# Patient Record
Sex: Male | Born: 1935 | Race: White | Hispanic: No | Smoking: Never smoker
Health system: Southern US, Community
[De-identification: ages and names within clinical notes are randomized; demographics above are authoritative.]

## PROBLEM LIST (undated history)

## (undated) DIAGNOSIS — E119 Type 2 diabetes mellitus without complications: Secondary | ICD-10-CM

## (undated) DIAGNOSIS — I890 Lymphedema, not elsewhere classified: Secondary | ICD-10-CM

## (undated) DIAGNOSIS — N189 Chronic kidney disease, unspecified: Secondary | ICD-10-CM

## (undated) DIAGNOSIS — J449 Chronic obstructive pulmonary disease, unspecified: Secondary | ICD-10-CM

## (undated) DIAGNOSIS — I509 Heart failure, unspecified: Secondary | ICD-10-CM

## (undated) DIAGNOSIS — I10 Essential (primary) hypertension: Secondary | ICD-10-CM

## (undated) HISTORY — PX: REPLACEMENT TOTAL KNEE: SUR1224

## (undated) HISTORY — DX: Heart failure, unspecified: I50.9

## (undated) HISTORY — PX: CATARACT EXTRACTION, BILATERAL: SHX1313

---

## 2003-03-31 ENCOUNTER — Encounter: Payer: Self-pay | Admitting: Orthopaedic Surgery

## 2003-03-31 ENCOUNTER — Ambulatory Visit: Admission: RE | Admit: 2003-03-31 | Discharge: 2003-03-31 | Payer: Self-pay | Admitting: Orthopaedic Surgery

## 2003-04-27 ENCOUNTER — Inpatient Hospital Stay (HOSPITAL_COMMUNITY): Admission: RE | Admit: 2003-04-27 | Discharge: 2003-04-30 | Payer: Self-pay | Admitting: Orthopaedic Surgery

## 2003-04-30 ENCOUNTER — Inpatient Hospital Stay (HOSPITAL_COMMUNITY)
Admission: RE | Admit: 2003-04-30 | Discharge: 2003-05-06 | Payer: Self-pay | Admitting: Physical Medicine & Rehabilitation

## 2004-12-24 ENCOUNTER — Inpatient Hospital Stay: Payer: Self-pay | Admitting: Internal Medicine

## 2004-12-29 ENCOUNTER — Encounter: Payer: Self-pay | Admitting: Physical Medicine and Rehabilitation

## 2005-03-02 ENCOUNTER — Encounter: Payer: Self-pay | Admitting: Physical Medicine and Rehabilitation

## 2005-03-31 ENCOUNTER — Encounter: Payer: Self-pay | Admitting: Physical Medicine and Rehabilitation

## 2006-08-30 ENCOUNTER — Inpatient Hospital Stay: Payer: Self-pay | Admitting: Internal Medicine

## 2006-09-02 ENCOUNTER — Other Ambulatory Visit: Payer: Self-pay

## 2009-01-19 ENCOUNTER — Encounter: Payer: Self-pay | Admitting: Internal Medicine

## 2009-01-25 ENCOUNTER — Ambulatory Visit: Payer: Self-pay | Admitting: Internal Medicine

## 2009-01-29 ENCOUNTER — Encounter: Payer: Self-pay | Admitting: Internal Medicine

## 2009-03-01 ENCOUNTER — Encounter: Payer: Self-pay | Admitting: Internal Medicine

## 2009-03-31 ENCOUNTER — Encounter: Payer: Self-pay | Admitting: Internal Medicine

## 2009-05-01 ENCOUNTER — Encounter: Payer: Self-pay | Admitting: Internal Medicine

## 2009-06-21 ENCOUNTER — Encounter: Payer: Self-pay | Admitting: Internal Medicine

## 2010-05-22 ENCOUNTER — Inpatient Hospital Stay: Payer: Self-pay | Admitting: Internal Medicine

## 2010-05-24 LAB — PSA

## 2011-07-02 ENCOUNTER — Encounter: Payer: Self-pay | Admitting: Cardiothoracic Surgery

## 2011-07-02 ENCOUNTER — Encounter: Payer: Self-pay | Admitting: Nurse Practitioner

## 2011-07-03 ENCOUNTER — Ambulatory Visit: Payer: Self-pay | Admitting: Internal Medicine

## 2012-12-23 ENCOUNTER — Encounter: Payer: Self-pay | Admitting: Cardiothoracic Surgery

## 2012-12-23 ENCOUNTER — Encounter: Payer: Self-pay | Admitting: Nurse Practitioner

## 2012-12-30 ENCOUNTER — Encounter: Payer: Self-pay | Admitting: Nurse Practitioner

## 2012-12-30 ENCOUNTER — Encounter: Payer: Self-pay | Admitting: Cardiothoracic Surgery

## 2013-01-29 ENCOUNTER — Encounter: Payer: Self-pay | Admitting: Nurse Practitioner

## 2013-01-29 ENCOUNTER — Encounter: Payer: Self-pay | Admitting: Cardiothoracic Surgery

## 2013-03-01 ENCOUNTER — Encounter: Payer: Self-pay | Admitting: Cardiothoracic Surgery

## 2013-03-01 ENCOUNTER — Encounter: Payer: Self-pay | Admitting: Nurse Practitioner

## 2014-03-09 ENCOUNTER — Encounter: Payer: Self-pay | Admitting: Surgery

## 2014-04-06 ENCOUNTER — Encounter: Payer: Self-pay | Admitting: Nurse Practitioner

## 2014-05-01 ENCOUNTER — Encounter: Payer: Self-pay | Admitting: Nurse Practitioner

## 2014-06-01 ENCOUNTER — Encounter: Payer: Self-pay | Admitting: Nurse Practitioner

## 2016-02-16 ENCOUNTER — Encounter: Payer: Self-pay | Admitting: Emergency Medicine

## 2016-02-16 ENCOUNTER — Inpatient Hospital Stay
Admission: EM | Admit: 2016-02-16 | Discharge: 2016-02-20 | DRG: 558 | Disposition: A | Discharge status: Home / Self Care | Payer: Medicare Other | Attending: Internal Medicine | Admitting: Internal Medicine

## 2016-02-16 ENCOUNTER — Emergency Department: Payer: Medicare Other

## 2016-02-16 DIAGNOSIS — N390 Urinary tract infection, site not specified: Secondary | ICD-10-CM | POA: Diagnosis present

## 2016-02-16 DIAGNOSIS — M25511 Pain in right shoulder: Secondary | ICD-10-CM

## 2016-02-16 DIAGNOSIS — R0902 Hypoxemia: Secondary | ICD-10-CM

## 2016-02-16 DIAGNOSIS — I10 Essential (primary) hypertension: Secondary | ICD-10-CM | POA: Diagnosis present

## 2016-02-16 DIAGNOSIS — M6282 Rhabdomyolysis: Principal | ICD-10-CM | POA: Diagnosis present

## 2016-02-16 DIAGNOSIS — Z8249 Family history of ischemic heart disease and other diseases of the circulatory system: Secondary | ICD-10-CM

## 2016-02-16 DIAGNOSIS — R600 Localized edema: Secondary | ICD-10-CM | POA: Diagnosis present

## 2016-02-16 DIAGNOSIS — N400 Enlarged prostate without lower urinary tract symptoms: Secondary | ICD-10-CM | POA: Diagnosis present

## 2016-02-16 DIAGNOSIS — Z66 Do not resuscitate: Secondary | ICD-10-CM | POA: Diagnosis present

## 2016-02-16 DIAGNOSIS — N179 Acute kidney failure, unspecified: Secondary | ICD-10-CM | POA: Diagnosis present

## 2016-02-16 DIAGNOSIS — D219 Benign neoplasm of connective and other soft tissue, unspecified: Secondary | ICD-10-CM | POA: Diagnosis present

## 2016-02-16 DIAGNOSIS — Z79899 Other long term (current) drug therapy: Secondary | ICD-10-CM

## 2016-02-16 DIAGNOSIS — I890 Lymphedema, not elsewhere classified: Secondary | ICD-10-CM | POA: Diagnosis present

## 2016-02-16 DIAGNOSIS — E860 Dehydration: Secondary | ICD-10-CM | POA: Diagnosis present

## 2016-02-16 HISTORY — DX: Essential (primary) hypertension: I10

## 2016-02-16 LAB — COMPREHENSIVE METABOLIC PANEL
ALBUMIN: 3.1 g/dL — AB (ref 3.5–5.0)
ALK PHOS: 73 U/L (ref 38–126)
ALT: 20 U/L (ref 17–63)
ANION GAP: 11 (ref 5–15)
AST: 62 U/L — ABNORMAL HIGH (ref 15–41)
BUN: 35 mg/dL — ABNORMAL HIGH (ref 6–20)
CALCIUM: 10.3 mg/dL (ref 8.9–10.3)
CHLORIDE: 102 mmol/L (ref 101–111)
CO2: 26 mmol/L (ref 22–32)
Creatinine, Ser: 2.03 mg/dL — ABNORMAL HIGH (ref 0.61–1.24)
GFR calc Af Amer: 34 mL/min — ABNORMAL LOW (ref >60)
GFR calc non Af Amer: 30 mL/min — ABNORMAL LOW (ref >60)
GLUCOSE: 215 mg/dL — AB (ref 65–99)
POTASSIUM: 3.6 mmol/L (ref 3.5–5.1)
SODIUM: 139 mmol/L (ref 135–145)
Total Bilirubin: 1 mg/dL (ref 0.3–1.2)
Total Protein: 7.2 g/dL (ref 6.5–8.1)

## 2016-02-16 LAB — CBC
HCT: 50.1 % (ref 40.0–52.0)
HEMOGLOBIN: 16.3 g/dL (ref 13.0–18.0)
MCH: 27.6 pg (ref 26.0–34.0)
MCHC: 32.5 g/dL (ref 32.0–36.0)
MCV: 84.9 fL (ref 80.0–100.0)
PLATELETS: 173 10*3/uL (ref 150–440)
RBC: 5.9 MIL/uL (ref 4.40–5.90)
RDW: 15.2 % — ABNORMAL HIGH (ref 11.5–14.5)
WBC: 26.7 10*3/uL — ABNORMAL HIGH (ref 3.8–10.6)

## 2016-02-16 LAB — URINALYSIS COMPLETE WITH MICROSCOPIC (ARMC ONLY)
Bacteria, UA: NONE SEEN
Bilirubin Urine: NEGATIVE
Glucose, UA: 150 mg/dL — AB
LEUKOCYTES UA: NEGATIVE
NITRITE: NEGATIVE
PH: 5 (ref 5.0–8.0)
SPECIFIC GRAVITY, URINE: 1.025 (ref 1.005–1.030)
Squamous Epithelial / LPF: NONE SEEN

## 2016-02-16 LAB — GLUCOSE, CAPILLARY: Glucose-Capillary: 240 mg/dL — ABNORMAL HIGH (ref 65–99)

## 2016-02-16 LAB — TROPONIN I: Troponin I: 0.12 ng/mL — ABNORMAL HIGH (ref <0.031)

## 2016-02-16 LAB — CK: Total CK: 4579 U/L — ABNORMAL HIGH (ref 49–397)

## 2016-02-16 MED ORDER — OXYCODONE HCL 5 MG PO TABS
5.0000 mg | ORAL_TABLET | ORAL | Status: DC | PRN
  Administered 2016-02-16 – 2016-02-17 (×2): 5 mg via ORAL
  Filled 2016-02-16 (×2): qty 1

## 2016-02-16 MED ORDER — ACETAMINOPHEN 325 MG PO TABS
650.0000 mg | ORAL_TABLET | Freq: Four times a day (QID) | ORAL | Status: DC | PRN
Start: 2016-02-16 — End: 2016-02-20

## 2016-02-16 MED ORDER — ONDANSETRON HCL 4 MG/2ML IJ SOLN: 4.0000 mg | Freq: Four times a day (QID) | INTRAMUSCULAR | Status: DC | PRN

## 2016-02-16 MED ORDER — ONDANSETRON HCL 4 MG PO TABS
4.0000 mg | ORAL_TABLET | Freq: Four times a day (QID) | ORAL | Status: DC | PRN
Start: 2016-02-16 — End: 2016-02-20

## 2016-02-16 MED ORDER — SODIUM CHLORIDE 0.9 % IV SOLN
1000.0000 mL | Freq: Once | INTRAVENOUS | Status: AC
  Administered 2016-02-16: 1000 mL via INTRAVENOUS

## 2016-02-16 MED ORDER — FINASTERIDE 5 MG PO TABS
5.0000 mg | ORAL_TABLET | Freq: Every day | ORAL | Status: DC
  Administered 2016-02-16 – 2016-02-20 (×5): 5 mg via ORAL
  Filled 2016-02-16 (×5): qty 1

## 2016-02-16 MED ORDER — AMLODIPINE BESYLATE 5 MG PO TABS
5.0000 mg | ORAL_TABLET | Freq: Every day | ORAL | Status: DC
  Administered 2016-02-16 – 2016-02-20 (×5): 5 mg via ORAL
  Filled 2016-02-16 (×5): qty 1

## 2016-02-16 MED ORDER — SODIUM CHLORIDE 0.9 % IV SOLN
INTRAVENOUS | Status: DC
  Administered 2016-02-16 – 2016-02-17 (×3): via INTRAVENOUS

## 2016-02-16 MED ORDER — ENOXAPARIN SODIUM 30 MG/0.3ML ~~LOC~~ SOLN: 30.0000 mg | SUBCUTANEOUS | Status: DC

## 2016-02-16 MED ORDER — CEFTRIAXONE SODIUM 1 G IJ SOLR
1.0000 g | Freq: Once | INTRAMUSCULAR | Status: AC
  Administered 2016-02-16: 1 g via INTRAVENOUS
  Filled 2016-02-16: qty 10

## 2016-02-16 MED ORDER — DEXTROSE 5 % IV SOLN
1.0000 g | INTRAVENOUS | Status: DC
  Administered 2016-02-17 – 2016-02-19 (×3): 1 g via INTRAVENOUS
  Filled 2016-02-16 (×4): qty 10

## 2016-02-16 MED ORDER — ACETAMINOPHEN 650 MG RE SUPP
650.0000 mg | Freq: Four times a day (QID) | RECTAL | Status: DC | PRN
Start: 2016-02-16 — End: 2016-02-20

## 2016-02-16 MED ORDER — LORATADINE 10 MG PO TABS
10.0000 mg | ORAL_TABLET | Freq: Every day | ORAL | Status: DC | PRN
Start: 2016-02-16 — End: 2016-02-20

## 2016-02-16 MED ORDER — ENOXAPARIN SODIUM 40 MG/0.4ML ~~LOC~~ SOLN
40.0000 mg | SUBCUTANEOUS | Status: DC
  Administered 2016-02-16: 18:00:00 40 mg via SUBCUTANEOUS
  Filled 2016-02-16: qty 0.4

## 2016-02-16 NOTE — ED Notes (Signed)
MD at bedside. 

## 2016-02-16 NOTE — ED Notes (Signed)
Lab called regarding add on urine culture at this time.

## 2016-02-16 NOTE — ED Provider Notes (Signed)
[facility] Emergency Department Provider Note  ____________________________________________    I have reviewed the triage vital signs and the nursing notes.   HISTORY  Chief Complaint Fall    HPI James Collins is a 81 y.o. male who presents from home. Patient apparently fell 2 days ago and has been lying on the floor since. He was unable to get up. He reports he did not hit his head. He denies dizziness, fevers, chest pain. He was found by [employer] representative     Past Medical History  Diagnosis Date  . Hypertension     There are no active problems to display for this patient.   No past surgical history on file.  No current outpatient prescriptions on file.  Allergies Review of patient's allergies indicates no known allergies.  No family history on file.  Social History Social History  Substance Use Topics  . Smoking status: Never Smoker   . Smokeless tobacco: None  . Alcohol Use: No    Review of Systems  Constitutional: Negative for fever. Eyes: Negative for redness  Cardiovascular: Negative for chest pain Respiratory: Negative for Cough Gastrointestinal: Negative for abdominal pain Genitourinary: Negative for dysuria. Musculoskeletal: Negative for back pain. Skin: Positive for right leg rash Neurological: Negative for focal weakness Psychiatric: no anxiety    ____________________________________________   PHYSICAL EXAM:  VITAL SIGNS: ED Triage Vitals  Enc Vitals Group     BP 02/16/16 1146 118/79 mmHg     Pulse Rate 02/16/16 1146 102     Resp 02/16/16 1146 18     Temp 02/16/16 1146 97.6 F (36.4 C)     Temp Source 02/16/16 1146 Oral     SpO2 02/16/16 1146 94 %     Weight 02/16/16 1146 250 lb (113.399 kg)     Height 02/16/16 1146 5\' 9"  (1.753 m)     Head Cir --      Peak Flow --      Pain Score --      Pain Loc --      Pain Edu? --      Excl. in [place]? --     Constitutional: Alert and oriented.   Eyes: Conjunctivae are normal. No erythema or injection ENT   Head: Normocephalic and atraumatic.   Mouth/Throat: Mucous membranes are moist. Cardiovascular: Normal rate, regular rhythm. Normal and symmetric distal pulses are present in the upper extremities.  Respiratory: Normal respiratory effort without tachypnea nor retractions. Breath sounds are clear and equal bilaterally.  Gastrointestinal: Soft and non-tender in all quadrants. No distention. There is no CVA tenderness. Genitourinary: No abnormalities Musculoskeletal: Nontender with normal range of motion in all extremities. No lower extremity tenderness nor edema. Neurologic:  Normal speech and language. No gross focal neurologic deficits are appreciated. Skin:  Skin is warm, dry and intact. Erythema and several bullae along the right flank/right hip or patient was apparently lying in his own urine Psychiatric: Mood and affect are normal. Patient exhibits appropriate insight and judgment.  ____________________________________________    LABS (pertinent positives/negatives)  Labs Reviewed  CBC - Abnormal; Notable for the following:    WBC 26.7 (*)    RDW 15.2 (*)    All other components within normal limits  COMPREHENSIVE METABOLIC PANEL - Abnormal; Notable for the following:    Glucose, Bld 215 (*)    BUN 35 (*)    Creatinine, Ser 2.03 (*)    Albumin 3.1 (*)    AST 62 (*)  GFR calc non Af Amer 30 (*)    GFR calc Af Amer 34 (*)    All other components within normal limits  CK - Abnormal; Notable for the following:    Total CK 4579 (*)    All other components within normal limits  TROPONIN I - Abnormal; Notable for the following:    Troponin I 0.12 (*)    All other components within normal limits  URINALYSIS COMPLETEWITH MICROSCOPIC ([facility] ONLY)    ____________________________________________   EKG  ED ECG REPORT I, Lavonia Drafts, the attending physician, personally viewed and interpreted this  ECG.  Date: 02/16/2016 EKG Time: 1:24 PM  Rhythm: normal sinus rhythm QRS Axis: normal Intervals: normal ST/T Wave abnormalities: normal Conduction Disturbances: none    ____________________________________________    RADIOLOGY  Chest x-ray unremarkable  ____________________________________________   PROCEDURES  Procedure(s) performed: none  Critical Care performed: yes  CRITICAL CARE Performed by: Lavonia Drafts   Total critical care time: 30 minutes  Critical care time was exclusive of separately billable procedures and treating other patients.  Critical care was necessary to treat or prevent imminent or life-threatening deterioration.  Critical care was time spent personally by me on the following activities: development of treatment plan with patient and/or surrogate as well as nursing, discussions with consultants, evaluation of patient's response to treatment, examination of patient, obtaining history from patient or surrogate, ordering and performing treatments and interventions, ordering and review of laboratory studies, ordering and review of radiographic studies, pulse oximetry and re-evaluation of patient's condition.   ____________________________________________   INITIAL IMPRESSION / ASSESSMENT AND PLAN / ED COURSE  Pertinent labs & imaging results that were available during my care of the patient were reviewed by me and considered in my medical decision making (see chart for details).  Patient presents from home after being on the floor for 2 days. He does have some skin breakdown on the right, concern for rhabdo. We will send labs, give IV fluids check x-ray and urine and reevaluate  ----------------------------------------- 2:49 PM on 02/16/2016 -----------------------------------------  Patient with elevated troponin, but unremarkable EKG. elevated CK consistent with rhabdomyolysis. He also has a significantly elevated white blood cell count  although no evidence of infection. We will give IV fluids and admit to the hospital for further management  ____________________________________________   FINAL CLINICAL IMPRESSION(S) / ED DIAGNOSES  Final diagnoses:  Non-traumatic rhabdomyolysis          Lavonia Drafts, MD 02/16/16 1551

## 2016-02-16 NOTE — ED Notes (Signed)
Pt arrived via EMS from home. Pt was found on the floor of his home by [employer]. Unknown how long pt was laying on floor. Upon EMS arrival, pt was alert but disoriented to date and time. Pt is incontinent of urine and clothes are soaked with urine. EMS reports 128/98, CBG 294, 89 % RA. EMS applied O2 3L via nasal cannula.
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
## 2016-02-16 NOTE — ED Notes (Signed)
Patient had wet gown and bed linen. Changed gown, linen and bed pad and put a depends back on the patient. Patient in bed resting at this time, requesting nothing further.

## 2016-02-17 ENCOUNTER — Inpatient Hospital Stay: Payer: Medicare Other

## 2016-02-17 LAB — CK: Total CK: 3689 U/L — ABNORMAL HIGH (ref 49–397)

## 2016-02-17 LAB — BASIC METABOLIC PANEL
Anion gap: 7 (ref 5–15)
BUN: 45 mg/dL — ABNORMAL HIGH (ref 6–20)
CALCIUM: 8.6 mg/dL — AB (ref 8.9–10.3)
CO2: 25 mmol/L (ref 22–32)
CREATININE: 2 mg/dL — AB (ref 0.61–1.24)
Chloride: 106 mmol/L (ref 101–111)
GFR calc Af Amer: 35 mL/min — ABNORMAL LOW (ref >60)
GFR, EST NON AFRICAN AMERICAN: 30 mL/min — AB (ref >60)
GLUCOSE: 193 mg/dL — AB (ref 65–99)
POTASSIUM: 3.6 mmol/L (ref 3.5–5.1)
SODIUM: 138 mmol/L (ref 135–145)

## 2016-02-17 LAB — HEMOGLOBIN A1C: HEMOGLOBIN A1C: 7.2 % — AB (ref 4.0–6.0)

## 2016-02-17 LAB — GLUCOSE, CAPILLARY
Glucose-Capillary: 172 mg/dL — ABNORMAL HIGH (ref 65–99)
Glucose-Capillary: 160 mg/dL — ABNORMAL HIGH (ref 65–99)
Glucose-Capillary: 187 mg/dL — ABNORMAL HIGH (ref 65–99)

## 2016-02-17 LAB — TROPONIN I: Troponin I: 0.11 ng/mL — ABNORMAL HIGH (ref <0.031)

## 2016-02-17 MED ORDER — INSULIN ASPART 100 UNIT/ML ~~LOC~~ SOLN
0.0000 [IU] | Freq: Three times a day (TID) | SUBCUTANEOUS | Status: DC
  Administered 2016-02-17 (×2): 2 [IU] via SUBCUTANEOUS
  Administered 2016-02-18 (×2): 1 [IU] via SUBCUTANEOUS
  Administered 2016-02-18: 2 [IU] via SUBCUTANEOUS
  Administered 2016-02-19: 1 [IU] via SUBCUTANEOUS
  Administered 2016-02-19 – 2016-02-20 (×4): 2 [IU] via SUBCUTANEOUS
  Filled 2016-02-17 (×2): qty 2
  Filled 2016-02-17: qty 1
  Filled 2016-02-17: qty 2
  Filled 2016-02-17: qty 1
  Filled 2016-02-17 (×2): qty 2
  Filled 2016-02-17: qty 1
  Filled 2016-02-17 (×2): qty 2

## 2016-02-17 MED ORDER — ENOXAPARIN SODIUM 40 MG/0.4ML ~~LOC~~ SOLN
40.0000 mg | Freq: Two times a day (BID) | SUBCUTANEOUS | Status: DC
  Administered 2016-02-17 – 2016-02-20 (×7): 40 mg via SUBCUTANEOUS
  Filled 2016-02-17 (×7): qty 0.4
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
## 2016-02-17 NOTE — Progress Notes (Signed)
Inpatient Diabetes Program Recommendations  AACE/ADA: New Consensus Statement on Inpatient Glycemic Control (2015)  Target Ranges:  Prepandial:   less than 140 mg/dL      Peak postprandial:   less than 180 mg/dL (1-2 hours)      Critically ill patients:  140 - 180 mg/dL  Results for SHAI, FREUDENTHAL (MRN [id number]) as of 02/17/2016 09:08  Ref. Range 02/16/2016 12:35 02/17/2016 04:10  Glucose Latest Ref Range: 65-99 mg/dL 215 (H) 193 (H)   Review of Glycemic Control  Diabetes history: DM2 hx (per Care Everywhere tab;noted in office note by podiatry on 02/08/16 and cardiology note on 09/21/15) Outpatient Diabetes medications: None Current orders for Inpatient glycemic control: None  Inpatient Diabetes Program Recommendations: Correction (SSI): Please order CBGs with Novolog correction scale ACHS. HgbA1C: Please order an A1C to evaluate glycemic control over the past 2-3 months.  Thanks, Barnie Alderman, RN, MSN, CDE Diabetes Coordinator Inpatient Diabetes Program [phone] (Team Pager from [place] to [place]) [phone] ([facility] office) [phone] [facility] office) [phone] [facility] office)

## 2016-02-17 NOTE — Progress Notes (Signed)
PT Hold Note  Patient Details Name: James Collins MRN: [id number] DOB: [birth year]   Cancelled Treatment:    Reason Eval/Treat Not Completed: Medical issues which prohibited therapy. Per MD notes pt complaining of R shoulder pain secondary to fall and prolonged time on ground. Radiographs pending to confirm no acute injury. Will await imaging to ensure appropriate RUE WB status prior to evaluation. Given patient's weakness and limited transfers from wheelchair at baseline per medical record, UE WB status can make a considerable difference in discharge planning.   Lyndel Safe Burdett Pinzon PT, DPT   James Collins 02/17/2016, 11:06 AM

## 2016-02-17 NOTE — Progress Notes (Signed)
   02/17/16 1100  Clinical Encounter Type  Visited With Patient and family together  Visit Type Initial  Referral From Nurse  Consult/Referral To Chaplain  Spiritual Encounters  Spiritual Needs Literature;Prayer  Stress Factors  Patient Stress Factors Exhausted;Health changes  Advance Directives (For Healthcare)  Does patient have an advance directive? No  Would patient like information on creating an advanced directive? Yes - Scientist, clinical (histocompatibility and immunogenetics) given  Type of Paramedic of [place];Living will  Does patient want to make changes to advanced directive? Yes - information given  Provided patient with AD documents and educated on their use and process for completion. Left documents with patient to complete and will follow up when patient is prepared to proceed. Prayed with patient & family. Chap. Micahel Omlor G. [place]

## 2016-02-17 NOTE — Progress Notes (Signed)
[facility] NAME: Grady Strohschein    MR#:  [id number]  DATE OF BIRTH:  11/11/1934  SUBJECTIVE:  CHIEF COMPLAINT:   Chief Complaint  Patient presents with  . Fall  Complains of some stiffness and weakness of the right shoulder area. He was laying on his right shoulder for 3 days.  REVIEW OF SYSTEMS:    Review of Systems  Constitutional: Positive for malaise/fatigue. Negative for fever and chills.  HENT: Negative for sore throat.   Eyes: Negative for blurred vision, double vision and pain.  Respiratory: Negative for cough, hemoptysis, shortness of breath and wheezing.   Cardiovascular: Negative for chest pain, palpitations, orthopnea and leg swelling.  Gastrointestinal: Negative for heartburn, nausea, vomiting, abdominal pain, diarrhea and constipation.  Genitourinary: Negative for dysuria and hematuria.  Musculoskeletal: Positive for joint pain and falls. Negative for back pain.  Skin: Negative for rash.  Neurological: Positive for weakness. Negative for sensory change, speech change, focal weakness and headaches.  Endo/Heme/Allergies: Does not bruise/bleed easily.  Psychiatric/Behavioral: Negative for depression. The patient is not nervous/anxious.     DRUG ALLERGIES:  No Known Allergies  VITALS:  Blood pressure 134/75, pulse 86, temperature 98.3 F (36.8 C), temperature source Oral, resp. rate 16, height 5\' 9"  (1.753 m), weight 127.461 kg (281 lb), SpO2 90 %.  PHYSICAL EXAMINATION:   Physical Exam  GENERAL:  81 y.o.-year-old patient lying in the bed with no acute distress. Obese EYES: Pupils equal, round, reactive to light and accommodation. No scleral icterus. Extraocular muscles intact.  HEENT: Head atraumatic, normocephalic. Oropharynx and nasopharynx clear.  NECK:  Supple, no jugular venous distention. No thyroid enlargement, no tenderness.  LUNGS: Normal breath sounds bilaterally, no wheezing, rales,  rhonchi. No use of accessory muscles of respiration.  CARDIOVASCULAR: S1, S2 normal. No murmurs, rubs, or gallops.  ABDOMEN: Soft, nontender, nondistended. Bowel sounds present. No organomegaly or mass.  EXTREMITIES: No cyanosis, clubbing or edema b/l.   Decreased range of motion right shoulder NEUROLOGIC: Cranial nerves II through XII are intact. No focal Motor or sensory deficits b/l.   PSYCHIATRIC: The patient is alert and oriented x 3.  SKIN: No obvious rash, lesion, or ulcer. Stasis changes of bilateral lower extremity's.  LABORATORY PANEL:   CBC  Recent Labs Lab 02/16/16 1235  WBC 26.7*  HGB 16.3  HCT 50.1  PLT 173   ------------------------------------------------------------------------------------------------------------------ Chemistries   Recent Labs Lab 02/16/16 1235 02/17/16 0410  NA 139 138  K 3.6 3.6  CL 102 106  CO2 26 25  GLUCOSE 215* 193*  BUN 35* 45*  CREATININE 2.03* 2.00*  CALCIUM 10.3 8.6*  AST 62*  --   ALT 20  --   ALKPHOS 73  --   BILITOT 1.0  --    ------------------------------------------------------------------------------------------------------------------  Cardiac Enzymes  Recent Labs Lab 02/17/16 0410  TROPONINI 0.11*   ------------------------------------------------------------------------------------------------------------------  RADIOLOGY:  Dg Chest Portable 1 View  02/16/2016  CLINICAL DATA:  Unwitnessed fall.  Altered mental status EXAM: PORTABLE CHEST 1 VIEW COMPARISON:  May 22, 2010 FINDINGS: There is no edema or consolidation. Heart is mildly enlarged with pulmonary vascularity within normal limits. No adenopathy. No bone lesions. No pneumothorax. IMPRESSION: Mild cardiac enlargement.  No edema or consolidation. Electronically Signed   By: Lowella Grip III M.D.   On: 02/16/2016 12:51     ASSESSMENT AND PLAN:   Decorian Camarena is a 81 y.o. male with a known history of Hypertension,  chronic bilateral lower  extremity lymphedema, BPH comes to the emergency room after he was found by EMS laying near his bed. Patient reports sliding off the bed since Tuesday and was informed by [employer] to his family since he would not pick up the food or open the door. EMS went by to check on him and was found on the floor.   1. Acute rhabdomyolysis - CK still elevated -IV fluids for hydration -Follow up I's and O's, follow up CPK  2. Acute renal failure - Could have underlying Kidney disease. Unknown baseline. -Continue IV fluids for hydration and avoid nephrotoxins monitor I's and O's and monitor creatinine  3. Leukocytosis due to UTI -On IV ceftriaxone. -Follow up urine culture and change antibiotics to oral according to sensitivity  4. Chronic leg edema . Likely lymphedema  5. DVT prophylaxis subcutaneous Lovenox  6. Generalized weakness deconditioning fall, frequent Physical therapy to see patient Patient uses wheelchair majority of the time at home and uses walker to get in and out of the bathroom May need placement at skilled nursing facility for long-term care.  7. Social worker for discharge planning  8. Right shoulder pain and stiffness from fall. We'll x-ray of right shoulder Patient was laying on the right side and could have some muscle injury. No signs of compartment syndrome.  All the records are reviewed and case discussed with Care Management/Social Workerr. Management plans discussed with the patient, family and they are in agreement.  CODE STATUS: DNR  DVT Prophylaxis: SCDs  TOTAL TIME TAKING CARE OF THIS PATIENT: 35 minutes.   POSSIBLE D/C IN 1-2 DAYS, DEPENDING ON CLINICAL CONDITION.  Hillary Bow R M.D on 02/17/2016 at 10:44 AM  Between 7am to 6pm - Pager - [phone]  After 6pm go to [email or web address] - password [email or web address] [facility]  [facility]  Office  [phone]  CC: Primary care physician; No primary care provider on file.  Note: This dictation  was prepared with Dragon dictation along with smaller phrase technology. Any transcriptional errors that result from this process are unintentional.

## 2016-02-17 NOTE — Progress Notes (Signed)
Anticoagulation monitoring(Lovenox):  80yo M ordered Lovenox 40 mg Q24h  Filed Weights   02/16/16 1146 02/16/16 1743  Weight: 250 lb (113.399 kg) 281 lb (127.461 kg)   BMI 41.6   Lab Results  Component Value Date   CREATININE 2.00* 02/17/2016   CREATININE 2.03* 02/16/2016   Estimated Creatinine Clearance: 40.2 mL/min (by C-G formula based on Cr of 2). Hemoglobin & Hematocrit     Component Value Date/Time   HGB 16.3 02/16/2016 1235   HCT 50.1 02/16/2016 1235     Per Protocol for Patient with estCrcl > 30 ml/min and BMI > 40, will transition to Lovenox 40 mg Q12h.     Chinita Greenland PharmD Clinical Pharmacist 02/17/2016

## 2016-02-18 LAB — GLUCOSE, CAPILLARY
GLUCOSE-CAPILLARY: 145 mg/dL — AB (ref 65–99)
GLUCOSE-CAPILLARY: 164 mg/dL — AB (ref 65–99)
GLUCOSE-CAPILLARY: 139 mg/dL — AB (ref 65–99)
Glucose-Capillary: 148 mg/dL — ABNORMAL HIGH (ref 65–99)

## 2016-02-18 LAB — CBC WITH DIFFERENTIAL/PLATELET
BASOS ABS: 0 10*3/uL (ref 0–0.1)
BASOS PCT: 0 %
Band Neutrophils: 0 %
Blasts: 0 %
EOS PCT: 1 %
Eosinophils Absolute: 0.1 10*3/uL (ref 0–0.7)
HCT: 38.1 % — ABNORMAL LOW (ref 40.0–52.0)
Hemoglobin: 12.6 g/dL — ABNORMAL LOW (ref 13.0–18.0)
LYMPHS ABS: 2.3 10*3/uL (ref 1.0–3.6)
Lymphocytes Relative: 22 %
MCH: 28.4 pg (ref 26.0–34.0)
MCHC: 33.2 g/dL (ref 32.0–36.0)
MCV: 85.6 fL (ref 80.0–100.0)
METAMYELOCYTES PCT: 0 %
MONO ABS: 0.8 10*3/uL (ref 0.2–1.0)
MONOS PCT: 8 %
Myelocytes: 0 %
NEUTROS ABS: 7.3 10*3/uL — AB (ref 1.4–6.5)
NRBC: 0 /100{WBCs}
Neutrophils Relative %: 69 %
Other: 0 %
PLATELETS: 111 10*3/uL — AB (ref 150–440)
Promyelocytes Absolute: 0 %
RBC: 4.44 MIL/uL (ref 4.40–5.90)
RDW: 15.3 % — ABNORMAL HIGH (ref 11.5–14.5)
WBC: 10.5 10*3/uL (ref 3.8–10.6)

## 2016-02-18 LAB — BASIC METABOLIC PANEL WITH GFR
Anion gap: 6 (ref 5–15)
BUN: 47 mg/dL — ABNORMAL HIGH (ref 6–20)
CO2: 25 mmol/L (ref 22–32)
Calcium: 7.8 mg/dL — ABNORMAL LOW (ref 8.9–10.3)
Chloride: 107 mmol/L (ref 101–111)
Creatinine, Ser: 1.92 mg/dL — ABNORMAL HIGH (ref 0.61–1.24)
GFR calc Af Amer: 37 mL/min — ABNORMAL LOW
GFR calc non Af Amer: 32 mL/min — ABNORMAL LOW
Glucose, Bld: 149 mg/dL — ABNORMAL HIGH (ref 65–99)
Potassium: 3.4 mmol/L — ABNORMAL LOW (ref 3.5–5.1)
Sodium: 138 mmol/L (ref 135–145)

## 2016-02-18 LAB — CK: CK TOTAL: 2257 U/L — AB (ref 49–397)

## 2016-02-18 LAB — URINE CULTURE

## 2016-02-18 MED ORDER — POTASSIUM CHLORIDE CRYS ER 20 MEQ PO TBCR
40.0000 meq | EXTENDED_RELEASE_TABLET | Freq: Once | ORAL | Status: AC
  Administered 2016-02-18: 17:00:00 40 meq via ORAL
  Filled 2016-02-18: qty 2

## 2016-02-18 MED ORDER — DOCUSATE SODIUM 100 MG PO CAPS
200.0000 mg | ORAL_CAPSULE | Freq: Two times a day (BID) | ORAL | Status: DC
  Administered 2016-02-18 – 2016-02-20 (×4): 200 mg via ORAL
  Filled 2016-02-18 (×4): qty 2

## 2016-02-18 MED ORDER — SODIUM CHLORIDE 0.9% FLUSH
3.0000 mL | Freq: Two times a day (BID) | INTRAVENOUS | Status: DC
  Administered 2016-02-18 – 2016-02-20 (×5): 3 mL via INTRAVENOUS

## 2016-02-18 NOTE — Progress Notes (Signed)
[facility] [place] NAME: James Collins    MR#:  [id number]  DATE OF BIRTH:  April 28, 1935  SUBJECTIVE:  CHIEF COMPLAINT:   Chief Complaint  Patient presents with  . Fall   Generalized weakness.  REVIEW OF SYSTEMS:    Review of Systems  Constitutional: Positive for malaise/fatigue. Negative for fever and chills.  HENT: Negative for sore throat.   Eyes: Negative for blurred vision, double vision and pain.  Respiratory: Negative for cough, hemoptysis, shortness of breath and wheezing.   Cardiovascular: Negative for chest pain, palpitations, orthopnea and leg swelling.  Gastrointestinal: Negative for heartburn, nausea, vomiting, abdominal pain, diarrhea and constipation.  Genitourinary: Negative for dysuria and hematuria.  Musculoskeletal: Negative for back pain.  Skin: Negative for rash.  Neurological: Positive for weakness. Negative for sensory change, speech change, focal weakness and headaches.  Endo/Heme/Allergies: Does not bruise/bleed easily.  Psychiatric/Behavioral: Negative for depression. The patient is not nervous/anxious.     DRUG ALLERGIES:  No Known Allergies  VITALS:  Blood pressure 129/66, pulse 89, temperature 98.4 F (36.9 C), temperature source Oral, resp. rate 19, height 5\' 9"  (1.753 m), weight 127.461 kg (281 lb), SpO2 95 %.  PHYSICAL EXAMINATION:   Physical Exam  GENERAL:  81 y.o.-year-old patient lying in the bed with no acute distress. Morbid Obese. EYES: Pupils equal, round, reactive to light and accommodation. No scleral icterus. Extraocular muscles intact.  HEENT: Head atraumatic, normocephalic. Oropharynx and nasopharynx clear.  NECK:  Supple, no jugular venous distention. No thyroid enlargement, no tenderness.  LUNGS: Normal breath sounds bilaterally, no wheezing, rales, rhonchi. No use of accessory muscles of respiration.  CARDIOVASCULAR: S1, S2 normal. No murmurs, rubs, or gallops.  ABDOMEN:  Soft, nontender, nondistended. Bowel sounds present. No organomegaly or mass.  EXTREMITIES: No cyanosis, clubbing or edema b/l.   Decreased range of motion right shoulder NEUROLOGIC: Cranial nerves II through XII are intact. No focal Motor or sensory deficits b/l.   PSYCHIATRIC: The patient is alert and oriented x 3.  SKIN: No obvious rash, lesion, or ulcer. Stasis changes of bilateral lower extremity's.  LABORATORY PANEL:   CBC  Recent Labs Lab 02/18/16 0423  WBC 10.5  HGB 12.6*  HCT 38.1*  PLT 111*   ------------------------------------------------------------------------------------------------------------------ Chemistries   Recent Labs Lab 02/16/16 1235  02/18/16 0423  NA 139  < > 138  K 3.6  < > 3.4*  CL 102  < > 107  CO2 26  < > 25  GLUCOSE 215*  < > 149*  BUN 35*  < > 47*  CREATININE 2.03*  < > 1.92*  CALCIUM 10.3  < > 7.8*  AST 62*  --   --   ALT 20  --   --   ALKPHOS 73  --   --   BILITOT 1.0  --   --   < > = values in this interval not displayed. ------------------------------------------------------------------------------------------------------------------  Cardiac Enzymes  Recent Labs Lab 02/17/16 0410  TROPONINI 0.11*   ------------------------------------------------------------------------------------------------------------------  RADIOLOGY:  Dg Chest 2 View  02/17/2016  CLINICAL DATA:  Hypoxia EXAM: CHEST  2 VIEW COMPARISON:  02/16/2016 FINDINGS: Lungs are clear.  No pleural effusion or pneumothorax. The heart is mildly enlarged. Degenerative changes of the visualized thoracolumbar spine. IMPRESSION: No evidence of acute cardiopulmonary disease. Electronically Signed   By: Julian Hy M.D.   On: 02/17/2016 18:26   Dg Shoulder Right  02/17/2016  CLINICAL DATA:  Fall 3  days ago, right shoulder pain EXAM: RIGHT SHOULDER - 2+ VIEW COMPARISON:  None. FINDINGS: Two views of the right shoulder submitted. No acute fracture or subluxation.  Moderate degenerative changes AC joint. Mild narrowing of glenohumeral joint space. IMPRESSION: No acute fracture or subluxation. Degenerative changes AC joint and glenohumeral joint. Electronically Signed   By: Lahoma Crocker M.D.   On: 02/17/2016 11:42     ASSESSMENT AND PLAN:   James Collins is a 81 y.o. male with a known history of Hypertension, chronic bilateral lower extremity lymphedema, BPH comes to the emergency room after he was found by EMS laying near his bed. Patient reports sliding off the bed since Tuesday and was informed by [employer] to his family since he would not pick up the food or open the door. EMS went by to check on him and was found on the floor.   1. Acute rhabdomyolysis - CK is improving but still elevated Continue IV fluids for hydration -Follow up I's and O's, follow up CPK  2. Acute renal failure - Could have underlying Kidney disease. Unknown baseline. -Continue IV fluids for hydration and avoid nephrotoxins monitor I's and O's and follow-up BMP.  3. Leukocytosis due to UTI -On IV ceftriaxone. Urine culture: AEROCOCCUS URINAE. Leukocytosis improved.  4. Chronic leg edema . Likely lymphedema  5. DVT prophylaxis subcutaneous Lovenox  6. Generalized weakness deconditioning fall, frequent Physical therapy suggested skilled nursing facility placement.   8. Right shoulder pain and stiffness from fall. Xray: No acute fracture or subluxation. Degenerative changes AC joint and glenohumeral joint.  All the records are reviewed and case discussed with Care Management/Social Workerr. Management plans discussed with the patient, family and they are in agreement.  CODE STATUS: DNR  DVT Prophylaxis: SCDs  TOTAL TIME TAKING CARE OF THIS PATIENT: 38 minutes.   POSSIBLE D/C TO SKILLED NURSING FACILITY IN 1-2 DAYS, DEPENDING ON CLINICAL CONDITION.  James Collins M.D on 02/18/2016 at 2:48 PM  Between 7am to 6pm - Pager - [phone]  After 6pm go to  [email or web address] - password [email or web address] [facility]  [place] [facility]  Office  [phone]  CC: Primary care physician; No primary care provider on file.  Note: This dictation was prepared with Dragon dictation along with smaller phrase technology. Any transcriptional errors that result from this process are unintentional.

## 2016-02-19 LAB — MAGNESIUM: Magnesium: 2.3 mg/dL (ref 1.7–2.4)

## 2016-02-19 LAB — BASIC METABOLIC PANEL
ANION GAP: 7 (ref 5–15)
BUN: 42 mg/dL — ABNORMAL HIGH (ref 6–20)
CALCIUM: 7.6 mg/dL — AB (ref 8.9–10.3)
CO2: 25 mmol/L (ref 22–32)
CREATININE: 1.78 mg/dL — AB (ref 0.61–1.24)
Chloride: 106 mmol/L (ref 101–111)
GFR, EST AFRICAN AMERICAN: 40 mL/min — AB (ref >60)
GFR, EST NON AFRICAN AMERICAN: 35 mL/min — AB (ref >60)
Glucose, Bld: 161 mg/dL — ABNORMAL HIGH (ref 65–99)
Potassium: 3.7 mmol/L (ref 3.5–5.1)
Sodium: 138 mmol/L (ref 135–145)

## 2016-02-19 LAB — GLUCOSE, CAPILLARY
GLUCOSE-CAPILLARY: 137 mg/dL — AB (ref 65–99)
GLUCOSE-CAPILLARY: 216 mg/dL — AB (ref 65–99)
Glucose-Capillary: 177 mg/dL — ABNORMAL HIGH (ref 65–99)
Glucose-Capillary: 159 mg/dL — ABNORMAL HIGH (ref 65–99)

## 2016-02-19 LAB — CK: CK TOTAL: 1439 U/L — AB (ref 49–397)

## 2016-02-19 MED ORDER — BISACODYL 5 MG PO TBEC
10.0000 mg | DELAYED_RELEASE_TABLET | Freq: Every day | ORAL | Status: DC | PRN
  Administered 2016-02-19: 10 mg via ORAL
  Filled 2016-02-19: qty 2

## 2016-02-19 NOTE — Progress Notes (Signed)
[facility] [place] NAME: James Collins    MR#:  [id number]  DATE OF BIRTH:  [birth year]  SUBJECTIVE:  CHIEF COMPLAINT:   Chief Complaint  Patient presents with  . Fall   Generalized weakness.  REVIEW OF SYSTEMS:    Review of Systems  Constitutional: Positive for malaise/fatigue. Negative for fever and chills.  HENT: Negative for sore throat.   Eyes: Negative for blurred vision, double vision and pain.  Respiratory: Negative for cough, hemoptysis, shortness of breath and wheezing.   Cardiovascular: Negative for chest pain, palpitations, orthopnea and leg swelling.  Gastrointestinal: Negative for heartburn, nausea, vomiting, abdominal pain, diarrhea and constipation.  Genitourinary: Negative for dysuria and hematuria.  Musculoskeletal: Negative for back pain.  Skin: Negative for rash.  Neurological: Positive for weakness. Negative for sensory change, speech change, focal weakness and headaches.  Endo/Heme/Allergies: Does not bruise/bleed easily.  Psychiatric/Behavioral: Negative for depression. The patient is not nervous/anxious.     DRUG ALLERGIES:  No Known Allergies  VITALS:  Blood pressure 130/61, pulse 79, temperature 98.2 F (36.8 C), temperature source Oral, resp. rate 20, height 5\' 9"  (1.753 m), weight 121.1 kg (266 lb 15.6 oz), SpO2 90 %.  PHYSICAL EXAMINATION:   Physical Exam  GENERAL:  81 y.o.-year-old patient lying in the bed with no acute distress. Morbid Obese. EYES: Pupils equal, round, reactive to light and accommodation. No scleral icterus. Extraocular muscles intact.  HEENT: Head atraumatic, normocephalic. Oropharynx and nasopharynx clear.  NECK:  Supple, no jugular venous distention. No thyroid enlargement, no tenderness.  LUNGS: Normal breath sounds bilaterally, no wheezing, rales, rhonchi. No use of accessory muscles of respiration.  CARDIOVASCULAR: S1, S2 normal. No murmurs, rubs, or gallops.   ABDOMEN: Soft, nontender, nondistended. Bowel sounds present. No organomegaly or mass.  EXTREMITIES: No cyanosis, clubbing or edema b/l.   Decreased range of motion right shoulder NEUROLOGIC: Cranial nerves II through XII are intact. No focal Motor or sensory deficits b/l.   PSYCHIATRIC: The patient is alert and oriented x 3.  SKIN: No obvious rash, lesion, or ulcer. Stasis changes of bilateral lower extremity's.  LABORATORY PANEL:   CBC  Recent Labs Lab 02/18/16 0423  WBC 10.5  HGB 12.6*  HCT 38.1*  PLT 111*   ------------------------------------------------------------------------------------------------------------------ Chemistries   Recent Labs Lab 02/16/16 1235  02/19/16 0427  NA 139  < > 138  K 3.6  < > 3.7  CL 102  < > 106  CO2 26  < > 25  GLUCOSE 215*  < > 161*  BUN 35*  < > 42*  CREATININE 2.03*  < > 1.78*  CALCIUM 10.3  < > 7.6*  MG  --   --  2.3  AST 62*  --   --   ALT 20  --   --   ALKPHOS 73  --   --   BILITOT 1.0  --   --   < > = values in this interval not displayed. ------------------------------------------------------------------------------------------------------------------  Cardiac Enzymes  Recent Labs Lab 02/17/16 0410  TROPONINI 0.11*   ------------------------------------------------------------------------------------------------------------------  RADIOLOGY:  Dg Chest 2 View  02/17/2016  CLINICAL DATA:  Hypoxia EXAM: CHEST  2 VIEW COMPARISON:  02/16/2016 FINDINGS: Lungs are clear.  No pleural effusion or pneumothorax. The heart is mildly enlarged. Degenerative changes of the visualized thoracolumbar spine. IMPRESSION: No evidence of acute cardiopulmonary disease. Electronically Signed   By: Julian Hy M.D.   On: 02/17/2016 18:26  ASSESSMENT AND PLAN:   James Collins is a 81 y.o. male with a known history of Hypertension, chronic bilateral lower extremity lymphedema, BPH comes to the emergency room after he was found  by EMS laying near his bed. Patient reports sliding off the bed since Tuesday and was informed by [employer] to his family since he would not pick up the food or open the door. EMS went by to check on him and was found on the floor.   1. Acute rhabdomyolysis - CK is improving. Down to 1439. Continue IV fluids for hydration -Follow up I's and O's, follow up CPK  2. Acute renal failure - Could have underlying Kidney disease. Unknown baseline. -Continue IV fluids for hydration and avoid nephrotoxins monitor I's and O's and follow-up BMP.  3. Leukocytosis due to UTI -On IV ceftriaxone. Urine culture: AEROCOCCUS URINAE. Leukocytosis improved.  4. Chronic leg edema . Likely lymphedema  5. DVT prophylaxis subcutaneous Lovenox  6. Generalized weakness deconditioning fall, frequent Physical therapy suggested skilled nursing facility placement.   8. Right shoulder pain and stiffness from fall. Xray: No acute fracture or subluxation. Degenerative changes AC joint and glenohumeral joint.  All the records are reviewed and case discussed with Care Management/Social Workerr. Management plans discussed with the patient, family and they are in agreement.  CODE STATUS: DNR  DVT Prophylaxis: SCDs  TOTAL TIME TAKING CARE OF THIS PATIENT: 35 minutes.   POSSIBLE D/C TO SKILLED NURSING FACILITY IN 1-2 DAYS, DEPENDING ON CLINICAL CONDITION.  Demetrios Loll M.D on 02/19/2016 at 1:38 PM  Between 7am to 6pm - Pager - [phone]  After 6pm go to [email or web address] - password [email or web address]  [place] [facility]  Office  [phone]  CC: Primary care physician; No primary care provider on file.  Note: This dictation was prepared with Dragon dictation along with smaller phrase technology. Any transcriptional errors that result from this process are unintentional.

## 2016-02-19 NOTE — NC FL2 (Signed)
[place] LEVEL OF CARE SCREENING TOOL     IDENTIFICATION  Patient Name: James Collins Birthdate: 16-Oct-1934 Sex: male Admission Date (Current Location): 02/16/2016  [place] and [place] Number:  Engineering geologist and Address:  [facility], [address], [place],  [address]      Provider Number: [id number]  Attending Physician Name and Address:  Demetrios Loll, MD  Relative Name and Phone Number:       Current Level of Care: Hospital Recommended Level of Care: [place] Prior Approval Number:    Date Approved/Denied:   PASRR Number: [id number]  Discharge Plan: SNF    Current Diagnoses: Patient Active Problem List   Diagnosis Date Noted  . 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    Orientation RESPIRATION BLADDER Height & Weight     Self, Time, Situation, Place  Normal Incontinent Weight: 266 lb 15.6 oz (121.1 kg) Height:  5\' 9"  (175.3 cm)  BEHAVIORAL SYMPTOMS/MOOD NEUROLOGICAL BOWEL NUTRITION STATUS      Continent Diet (Carb Modified)  AMBULATORY STATUS COMMUNICATION OF NEEDS Skin   Extensive Assist Verbally Normal                       Personal Care Assistance Level of Assistance  Bathing, Feeding, Dressing Bathing Assistance: Limited assistance Feeding assistance: Independent Dressing Assistance: Limited assistance     Functional Limitations Info  Sight, Hearing, Speech Sight Info: Adequate Hearing Info: Adequate Speech Info: Adequate    SPECIAL CARE FACTORS FREQUENCY  PT (By licensed PT)     PT Frequency: 5              Contractures      Additional Factors Info  Code Status, Allergies Code Status Info: DNR Allergies Info: No known allergies           Current Medications (02/19/2016):  This is the current hospital active medication list Current Facility-Administered Medications  Medication Dose Route Frequency Provider Last Rate Last Dose  . acetaminophen (TYLENOL) tablet 650 mg   650 mg Oral Q6H PRN Fritzi Mandes, MD       Or  . acetaminophen (TYLENOL) suppository 650 mg  650 mg Rectal Q6H PRN Fritzi Mandes, MD      . amLODipine (NORVASC) tablet 5 mg  5 mg Oral Daily Fritzi Mandes, MD   5 mg at 02/19/16 1032  . bisacodyl (DULCOLAX) EC tablet 10 mg  10 mg Oral Daily PRN Demetrios Loll, MD   10 mg at 02/19/16 1032  . cefTRIAXone (ROCEPHIN) 1 g in dextrose 5 % 50 mL IVPB  1 g Intravenous Q24H Fritzi Mandes, MD   1 g at 02/18/16 1709  . docusate sodium (COLACE) capsule 200 mg  200 mg Oral BID Demetrios Loll, MD   200 mg at 02/19/16 1032  . enoxaparin (LOVENOX) injection 40 mg  40 mg Subcutaneous Q12H Fritzi Mandes, MD   40 mg at 02/19/16 1033  . finasteride (PROSCAR) tablet 5 mg  5 mg Oral Daily Fritzi Mandes, MD   5 mg at 02/19/16 1032  . insulin aspart (novoLOG) injection 0-9 Units  0-9 Units Subcutaneous TID WC Hillary Bow, MD   1 Units at 02/19/16 0824  . loratadine (CLARITIN) tablet 10 mg  10 mg Oral Daily PRN Fritzi Mandes, MD      . ondansetron (ZOFRAN) tablet 4 mg  4 mg Oral Q6H PRN Fritzi Mandes, MD       Or  . ondansetron [facility])  injection 4 mg  4 mg Intravenous Q6H PRN Fritzi Mandes, MD      . oxyCODONE (Oxy IR/ROXICODONE) immediate release tablet 5 mg  5 mg Oral Q4H PRN Fritzi Mandes, MD   5 mg at 02/17/16 2129  . sodium chloride flush (NS) 0.9 % injection 3 mL  3 mL Intravenous BID Demetrios Loll, MD   3 mL at 02/19/16 1033     Discharge Medications: Please see discharge summary for a list of discharge medications.  Relevant Imaging Results:  Relevant Lab Results:   Additional Information SSN:  [id number]  Darden Dates, LCSW

## 2016-02-20 ENCOUNTER — Encounter
Admission: RE | Admit: 2016-02-20 | Discharge: 2016-02-20 | Disposition: A | Discharge status: Home / Self Care | Payer: Medicare Other | Source: Ambulatory Visit | Attending: Internal Medicine | Admitting: Internal Medicine

## 2016-02-20 LAB — BASIC METABOLIC PANEL
Anion gap: 5 (ref 5–15)
BUN: 40 mg/dL — AB (ref 6–20)
CALCIUM: 7.6 mg/dL — AB (ref 8.9–10.3)
CO2: 26 mmol/L (ref 22–32)
CREATININE: 1.66 mg/dL — AB (ref 0.61–1.24)
Chloride: 107 mmol/L (ref 101–111)
GFR calc Af Amer: 44 mL/min — ABNORMAL LOW (ref >60)
GFR, EST NON AFRICAN AMERICAN: 38 mL/min — AB (ref >60)
GLUCOSE: 164 mg/dL — AB (ref 65–99)
Potassium: 3.6 mmol/L (ref 3.5–5.1)
Sodium: 138 mmol/L (ref 135–145)

## 2016-02-20 LAB — GLUCOSE, CAPILLARY
GLUCOSE-CAPILLARY: 161 mg/dL — AB (ref 65–99)
Glucose-Capillary: 152 mg/dL — ABNORMAL HIGH (ref 65–99)

## 2016-02-20 LAB — CK: Total CK: 815 U/L — ABNORMAL HIGH (ref 49–397)

## 2016-02-20 NOTE — Care Management Important Message (Signed)
Important Message  Patient Details  Name: James Collins MRN: [id number] Date of Birth: March 12, 1935   Medicare Important Message Given:  Yes    [name] 02/20/2016, 11:19 AM

## 2016-02-20 NOTE — Discharge Summary (Signed)
[facility] [place] NAME: James Collins    MR#:  [id number]  DATE OF BIRTH:  10-06-1934  DATE OF ADMISSION:  02/16/2016 ADMITTING PHYSICIAN: Fritzi Mandes, MD  DATE OF DISCHARGE: 02/20/2016 PRIMARY CARE PHYSICIAN: No primary care provider on file.    ADMISSION DIAGNOSIS:  Non-traumatic rhabdomyolysis [M62.82]   DISCHARGE DIAGNOSIS:  Acute rhabdomyolysis Acute renal failure UTI with  Leukocytosis. SECONDARY DIAGNOSIS:   Past Medical History  Diagnosis Date  . Hypertension     HOSPITAL COURSE:   James Collins is a 81 y.o. male with a known history of Hypertension, chronic bilateral lower extremity lymphedema, BPH comes to the emergency room after he was found by EMS laying near his bed. Patient reports sliding off the bed since Tuesday and was informed by [employer] to his family since he would not pick up the food or open the door. EMS went by to check on him and was found on the floor.   1. Acute rhabdomyolysis - CK is improving. Down from 4579 to 815. Treated wtih IV fluids for hydration  2. Acute renal failure - Could have underlying Kidney disease. Unknown baseline. Treated with IV fluids for hydration and avoid nephrotoxins, follow-up BMP as outpatient.  3. Leukocytosis due to UT Treated with IV ceftriaxone. Urine culture: AEROCOCCUS URINAE. Leukocytosis improved.  4. Chronic leg edema . Likely lymphedema  5. DVT prophylaxis subcutaneous Lovenox  6. Generalized weakness deconditioning fall, frequent Physical therapy suggested skilled nursing facility placement.   8. Right shoulder pain and stiffness from fall. Xray: No acute fracture or subluxation. Degenerative changes AC joint and glenohumeral joint.  DISCHARGE CONDITIONS:   Stable, discharge to SNF today.  CONSULTS OBTAINED:     DRUG ALLERGIES:  No Known Allergies  DISCHARGE MEDICATIONS:   Current Discharge Medication List    CONTINUE  these medications which have NOT CHANGED   Details  amLODipine (NORVASC) 5 MG tablet Take 1 tablet by mouth daily.    benazepril (LOTENSIN) 20 MG tablet Take 1 tablet by mouth daily.    finasteride (PROSCAR) 5 MG tablet Take 1 tablet by mouth daily.    loratadine (CLARITIN) 10 MG tablet Take 1 tablet by mouth daily as needed for allergies.          DISCHARGE INSTRUCTIONS:    If you experience worsening of your admission symptoms, develop shortness of breath, life threatening emergency, suicidal or homicidal thoughts you must seek medical attention immediately by calling 911 or calling your MD immediately  if symptoms less severe.  You Must read complete instructions/literature along with all the possible adverse reactions/side effects for all the Medicines you take and that have been prescribed to you. Take any new Medicines after you have completely understood and accept all the possible adverse reactions/side effects.   Please note  You were cared for by a hospitalist during your hospital stay. If you have any questions about your discharge medications or the care you received while you were in the hospital after you are discharged, you can call the unit and asked to speak with the hospitalist on call if the hospitalist that took care of you is not available. Once you are discharged, your primary care physician will handle any further medical issues. Please note that NO REFILLS for any discharge medications will be authorized once you are discharged, as it is imperative that you return to your primary care physician (or establish a relationship with a primary  care physician if you do not have one) for your aftercare needs so that they can reassess your need for medications and monitor your lab values.    Today   SUBJECTIVE   No complaint.   VITAL SIGNS:  Blood pressure 162/63, pulse 72, temperature 97.7 F (36.5 C), temperature source Oral, resp. rate 20, height 5\' 9"  (1.753 m),  weight 121.1 kg (266 lb 15.6 oz), SpO2 95 %.  I/O:   Intake/Output Summary (Last 24 hours) at 02/20/16 0908 Last data filed at 02/20/16 0535  Gross per 24 hour  Intake    240 ml  Output    200 ml  Net     40 ml    PHYSICAL EXAMINATION:  GENERAL:  81 y.o.-year-old patient lying in the bed with no acute distress.  EYES: Pupils equal, round, reactive to light and accommodation. No scleral icterus. Extraocular muscles intact.  HEENT: Head atraumatic, normocephalic. Oropharynx and nasopharynx clear.  NECK:  Supple, no jugular venous distention. No thyroid enlargement, no tenderness.  LUNGS: Normal breath sounds bilaterally, no wheezing, rales,rhonchi or crepitation. No use of accessory muscles of respiration.  CARDIOVASCULAR: S1, S2 normal. No murmurs, rubs, or gallops.  ABDOMEN: Soft, non-tender, non-distended. Bowel sounds present. No organomegaly or mass.  EXTREMITIES: No pedal edema, cyanosis, or clubbing.  NEUROLOGIC: Cranial nerves II through XII are intact. Muscle strength 4/5 in all extremities. Sensation intact. Gait not checked.  PSYCHIATRIC: The patient is alert and oriented x 3.  SKIN: No obvious rash, lesion, or ulcer.   DATA REVIEW:   CBC  Recent Labs Lab 02/18/16 0423  WBC 10.5  HGB 12.6*  HCT 38.1*  PLT 111*    Chemistries   Recent Labs Lab 02/16/16 1235  02/19/16 0427 02/20/16 0424  NA 139  < > 138 138  K 3.6  < > 3.7 3.6  CL 102  < > 106 107  CO2 26  < > 25 26  GLUCOSE 215*  < > 161* 164*  BUN 35*  < > 42* 40*  CREATININE 2.03*  < > 1.78* 1.66*  CALCIUM 10.3  < > 7.6* 7.6*  MG  --   --  2.3  --   AST 62*  --   --   --   ALT 20  --   --   --   ALKPHOS 73  --   --   --   BILITOT 1.0  --   --   --   < > = values in this interval not displayed.  Cardiac Enzymes  Recent Labs Lab 02/17/16 0410  TROPONINI 0.11*    Microbiology Results  Results for orders placed or performed during the hospital encounter of 02/16/16  Urine culture     Status:  Abnormal   Collection Time: 02/16/16 12:35 PM  Result Value Ref Range Status   Specimen Description URINE, RANDOM  Final   Special Requests NONE  Final   Culture >=100,000 COLONIES/mL AEROCOCCUS URINAE (A)  Final   Report Status 02/18/2016 FINAL  Final    RADIOLOGY:  No results found.      Management plans discussed with the patient, family and they are in agreement.  CODE STATUS:     Code Status Orders        Start     Ordered   02/16/16 [id number]  Do not attempt resuscitation (DNR)   Continuous    Question Answer Comment  In the event of cardiac or respiratory ARREST Do  not call a "code blue"   In the event of cardiac or respiratory ARREST Do not perform Intubation, CPR, defibrillation or ACLS   In the event of cardiac or respiratory ARREST Use medication by any route, position, wound care, and other measures to relive pain and suffering. May use oxygen, suction and manual treatment of airway obstruction as needed for comfort.      02/16/16 1752    Code Status History    Date Active Date Inactive Code Status Order ID Comments User Context   This patient has a current code status but no historical code status.    Advance Directive Documentation        Most Recent Value   Type of Advance Directive  Healthcare Power of Attorney, Living will   Pre-existing out of facility DNR order (yellow form or pink MOST form)     "MOST" Form in Place?        TOTAL TIME TAKING CARE OF THIS PATIENT: 33 minutes.    Demetrios Loll M.D on 02/20/2016 at 9:08 AM  Between 7am to 6pm - Pager - [phone]  After 6pm go to [email or web address] - password [email or web address] [facility]  [facility]  Office  [phone]  CC: Primary care physician; No primary care provider on file.

## 2016-02-20 NOTE — Discharge Instructions (Signed)
Heart healthy diet. °Activity as tolerated. °Fall precaution. °

## 2016-02-20 NOTE — Progress Notes (Signed)
Pt was discharged today, IV removed, belongings packed and taken by pt's niece. Report called to [facility]. Ems transported pt.

## 2016-02-21 NOTE — Clinical Social Work Note (Signed)
Clinical Social Work Assessment  Patient Details  Name: James Collins MRN: [id number] Date of Birth: [birth year]  Date of referral:  02/21/16               Reason for consult:  Facility Placement                Permission sought to share information with:  Family Supports Permission granted to share information::  Yes, Verbal Permission Granted  Name::     Lisabeth Pick, counsin   Housing/Transportation Living arrangements for the past 2 months:  Single Family Home Source of Information:  Patient, Power of Forensic psychologist, Other (Comment Required) (Family ) Patient Interpreter Needed:  None Criminal Activity/Legal Involvement Pertinent to Current Situation/Hospitalization:  No - Comment as needed Significant Relationships:  Other Family Members Lives with:  Self Do you feel safe going back to the place where you live?  Yes Need for family participation in patient care:  Yes (Comment)  Care giving concerns:  No care giving concerns identified.   Social Worker assessment / plan:  CSW met with pt and cousin to address consult. CSW introduced herself and explained role of social work. CSW also explained the process of discharging to SNF. PT is recommending SNF. CSW intiaited SNF search and followed up with bed offers. Pt and pt's cousin chose [facility] Facility is able to accept pt as pt is ready for discharge today and they have received discharge information.   RN called report and EMS provided transportation. Pt and cousin are aware and agreeable to discharge plan. CSW is signing off as no further needs identified.   Employment status:  Retired Forensic scientist:  Medicare PT Recommendations:  [place] / Referral to community resources:  [place]  Patient/Family's Response to care:  Pt and cousin were appreciative of CSW support.   Patient/Family's Understanding of and Emotional Response to Diagnosis, Current Treatment, and Prognosis:  Pt  understands that he needs STR, which cousin is in greement.   Emotional Assessment Appearance:  Appears stated age Attitude/Demeanor/Rapport:  Other (Appropriate) Affect (typically observed):  Accepting, Pleasant Orientation:  Oriented to Self, Oriented to Situation, Oriented to Place, Oriented to  Time Alcohol / Substance use:  Never Used Psych involvement (Current and /or in the community):  No (Comment)  Discharge Needs  Concerns to be addressed:  Adjustment to Illness Readmission within the last 30 days:  No Current discharge risk:  None Barriers to Discharge:  No Barriers Identified   Darden Dates, LCSW 02/21/2016, 3:19 PM

## 2016-02-23 LAB — COMPREHENSIVE METABOLIC PANEL
ALT: 63 U/L (ref 17–63)
ANION GAP: 6 (ref 5–15)
AST: 44 U/L — ABNORMAL HIGH (ref 15–41)
Albumin: 2.6 g/dL — ABNORMAL LOW (ref 3.5–5.0)
Alkaline Phosphatase: 64 U/L (ref 38–126)
BUN: 28 mg/dL — ABNORMAL HIGH (ref 6–20)
CHLORIDE: 105 mmol/L (ref 101–111)
CO2: 28 mmol/L (ref 22–32)
CREATININE: 1.49 mg/dL — AB (ref 0.61–1.24)
Calcium: 7.7 mg/dL — ABNORMAL LOW (ref 8.9–10.3)
GFR, EST AFRICAN AMERICAN: 50 mL/min — AB (ref >60)
GFR, EST NON AFRICAN AMERICAN: 43 mL/min — AB (ref >60)
Glucose, Bld: 189 mg/dL — ABNORMAL HIGH (ref 65–99)
Potassium: 4.1 mmol/L (ref 3.5–5.1)
SODIUM: 139 mmol/L (ref 135–145)
Total Bilirubin: 0.4 mg/dL (ref 0.3–1.2)
Total Protein: 5.8 g/dL — ABNORMAL LOW (ref 6.5–8.1)

## 2016-02-23 LAB — CBC WITH DIFFERENTIAL/PLATELET
Basophils Absolute: 0 10*3/uL (ref 0–0.1)
Basophils Relative: 1 %
EOS ABS: 0.4 10*3/uL (ref 0–0.7)
HCT: 37.6 % — ABNORMAL LOW (ref 40.0–52.0)
Hemoglobin: 12.5 g/dL — ABNORMAL LOW (ref 13.0–18.0)
LYMPHS ABS: 1.6 10*3/uL (ref 1.0–3.6)
MCH: 28.1 pg (ref 26.0–34.0)
MCHC: 33.3 g/dL (ref 32.0–36.0)
MCV: 84.4 fL (ref 80.0–100.0)
MONO ABS: 0.7 10*3/uL (ref 0.2–1.0)
Neutro Abs: 5.3 10*3/uL (ref 1.4–6.5)
Neutrophils Relative %: 65 %
PLATELETS: 184 10*3/uL (ref 150–440)
RBC: 4.45 MIL/uL (ref 4.40–5.90)
RDW: 14.9 % — AB (ref 11.5–14.5)
WBC: 7.9 10*3/uL (ref 3.8–10.6)

## 2016-02-23 LAB — TSH: TSH: 8.248 u[IU]/mL — AB (ref 0.350–4.500)

## 2016-02-23 LAB — VITAMIN B12: VITAMIN B 12: 219 pg/mL (ref 180–914)

## 2016-02-24 LAB — VITAMIN D 25 HYDROXY (VIT D DEFICIENCY, FRACTURES): VIT D 25 HYDROXY: 7.2 ng/mL — AB (ref 30.0–100.0)

## 2016-03-01 ENCOUNTER — Encounter
Admission: RE | Admit: 2016-03-01 | Discharge: 2016-03-01 | Disposition: A | Discharge status: Home / Self Care | Payer: Medicare Other | Source: Ambulatory Visit | Attending: Internal Medicine | Admitting: Internal Medicine

## 2017-03-19 ENCOUNTER — Ambulatory Visit: Payer: Medicare Other | Admitting: Internal Medicine

## 2017-03-25 ENCOUNTER — Encounter: Payer: Medicare Other | Admitting: Physician Assistant

## 2017-05-29 ENCOUNTER — Encounter: Payer: Medicare Other | Admitting: Occupational Therapy

## 2017-05-30 ENCOUNTER — Encounter: Payer: Medicare Other | Admitting: Occupational Therapy

## 2017-06-11 ENCOUNTER — Ambulatory Visit: Payer: Medicare Other | Admitting: Occupational Therapy

## 2017-06-11 ENCOUNTER — Encounter: Payer: Self-pay | Admitting: Occupational Therapy

## 2017-06-11 ENCOUNTER — Ambulatory Visit: Payer: Medicare Other | Attending: Internal Medicine | Admitting: Occupational Therapy

## 2017-06-11 DIAGNOSIS — I890 Lymphedema, not elsewhere classified: Secondary | ICD-10-CM | POA: Diagnosis present

## 2017-06-12 ENCOUNTER — Ambulatory Visit: Payer: Medicare Other | Admitting: Occupational Therapy

## 2017-06-13 NOTE — Therapy (Addendum)
[place] MAIN [facility] SERVICES [address] [place], [place], [address] Phone: [phone]   Fax:  [phone]  Occupational Therapy Evaluation  Patient Details  Name: CHAYSE ZATARAIN MRN: [id number] Date of Birth: 08-12-1935 Referring Provider: Marden Noble, MD  Encounter Date: 06/11/2017    Past Medical History:  Diagnosis Date  . Hypertension     History reviewed. No pertinent surgical history.  There were no vitals filed for this visit.      Subjective Assessment - 06/17/17 1606    Subjective  Pt is referred to Occupational Therapy for rvaluation and treatment of BLE lymphedema by Laverle Patter, MD. Pt reports leg swelling onset as > than 10 years ago. Pt has not previously undergone CDT, but he does have a phematic "pump", which is reportedlly broken and has not been used for quite some time. Pt does not elevate his legs during the day. He sleeps in a recliner at night. Pt is accompanied by his cousin, Adonis Huguenin , who is also his health care power of attorne. Concerns Adonis Huguenin relates include Pt's poor hygine and lack of bathing and concern about more frequent falls. Katrina reports that Pt's stepson and his girlfriend have recently moved in to his home and they may be able to assist Pt w/ clinical participation in CDT and also in LE self care home program during visit intervals.   Patient is accompained by: Family member   Pertinent History Developmental delay, morbid obesity, OA, OSA (no C pap?,  mitral valve disease, CHF,  Hx DVT, essential hypertension, (malignant) Angina pectoris, hx abnormal EKG, hx TKA   Limitations difficulty walking, decreased standing tolerance, impaired functional mobility and transfers, impaired basic and instrumental ADLs, difficulty performing productive activities and participating in leisure pursuits, difficulty  with social participation   Patient Stated Goals get the sweelling in my legs down   Currently  in Pain? No/denies           [facility] OT Assessment - 06/17/17 0001      Assessment   Referring Provider Marden Noble, MD   Onset Date 10/01/06   Prior Therapy no CDT, has broken pump- not in use, difficulty tolerating and donning / dioffing off-the-shelf elastic compression stockins     Precautions   Precautions Other (comment)  LYMPHEDEMA PRECAUTIONs: CHF, DM, INFECTION RISK, FALL RISK     Balance Screen   Has the patient fallen in the past 6 months Yes   How many times? --  multiple x by family member report, but number unknown     Home  Environment   Family/patient expects to be discharged to: Private residence   Living Arrangements Other relatives   Available Help at Discharge Other (Comment)  stepson and girlfriend recently moved into residence   Type of Home Other (Comment)   Home Access Other (Comment)   Home Layout --  relatives assist w/ some IADLs, but not self care and transp   Bathroom Shower/Tub Tub/Shower unit;Other (comment)  not using shower and bathing infrequently by CG report   Shower/tub characteristics Curtain   Additional Comments sleeps in recliner   Lives With Son;Friend(s)     Prior Function   Level of Independence Independent with household mobility with device;Needs assistance with ADLs;Needs assistance with homemaking;Needs assistance with gait;Needs assistance with transfers   Vocation On disability   Leisure sedentary lifestyle- watches TV in lift  chair     Mobility   Mobility Status History of  falls     Activity Tolerance   Activity Tolerance Comments --  limited by impaired standing tolerance and balance     ROM / Strength   AROM / PROM / Strength --  WFL       BLE LYMPHEDEMA, SEVERE, STAGE  II 2/2  Suspected CVI, Morbid Obesity, dependent positioning, sedentary lifestyle. E  Skin Description Hyper-Keratosis Peau' de Orange Shiny Tight Fibrotic Fatty Doughy Indurated   x x x x x x  x   Hydration Dry Flaky Erythema Other   x  x x Non pitting   Color Redness Present Pallor Blanching Hemosiderin Staining Other   x  x x    Odor Malodorous Yeast Present Absent   X- mild      Temperature Warm Cool Normal   X mild     Pitting Edema 1+ 2+ 3+ 4+ >4          Girth Symmetrical Asymmetrical Other Distribution    X  L>R Toes to below knees  (A-D)   Stemmer Sign Positive Negative   Strong +    Lymphorrea History Of:  Present Absent   x     Wounds History Of (when) Present Absent Venous Arterial Pressure Size   x   ?      Signs of Infection Redness Warmth Erythema Acute Swelling Drainage                    Scars Adhesions Hypersensitivity        Sensation Light Touch Deep pressure Hypersensitivty   Present Absent Present Absent Present Absent       DM periferal neuropathy       Nails WNL Fungus Present Other    x    Hair Growth Symmetrical Asymmetrical   x                                   OT Treatments/Exercises (OP) - 06/17/17 0001      Transfers   Transfers Sit to Stand;Stand to Sit   Sit to Stand Other (comment);5: Supervision;With upper extremity assist  from sfirm clinic plinth w/ hips and knees at 90 degrees   Stand to Sit With upper extremity assist;Uncontrolled descent     ADLs   Overall ADLs According to family member Pt bathing infrequently and has urinarty incontinence, whic has contributed to recurrent cellulitis. Pt  has appropriate bathroom equipment , but no longer transfers into tub to bathe. Pt with difficulty reaching feet and distal legs to bathe and sress. Unable to donn and doff compression  stockings. Unable to inspect skin and perform skin care in keeping w/ DM precautions    Functional Mobility frequent falls   Cooking needs assistance- light meals only   Home Maintenance needs assistance   Financial Management needs assistance   Driving does not have licence   ADL Education Given Yes     Manual Therapy   Manual Therapy Edema management                          Patient will benefit from skilled therapeutic intervention in order to improve the following deficits and impairments:     Visit Diagnosis: Lymphedema, not elsewhere classified - Plan: Ot plan of care cert/re-cert    Problem List Patient Active Problem List   Diagnosis Date Noted  . 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

## 2017-06-13 NOTE — Patient Instructions (Signed)

## 2017-06-17 NOTE — Therapy (Addendum)
[place] MAIN [facility] SERVICES [address] [place], [place], [address] Phone: [phone]   Fax:  [phone]  Occupational Therapy Evaluation  Patient Details  Name: James Collins MRN: [id number] Date of Birth: September 09, 1935 Referring Provider: Marden Noble, MD  Encounter Date: 06/11/2017    Past Medical History:  Diagnosis Date  . Hypertension     History reviewed. No pertinent surgical history.  There were no vitals filed for this visit.      Subjective Assessment - 06/17/17 1606    Subjective  Pt is referred to Occupational Therapy for rvaluation and treatment of BLE lymphedema by Laverle Patter, MD. Pt reports leg swelling onset as > than 10 years ago. Pt has not previously undergone CDT, but he does have a phematic "pump", which is reportedlly broken and has not been used for quite some time. Pt does not elevate his legs during the day. He sleeps in a recliner at night. Pt is accompanied by his cousin, Adonis Huguenin , who is also his health care power of attorne. Concerns Adonis Huguenin relates include Pt's poor hygine and lack of bathing and concern about more frequent falls. Katrina reports that Pt's stepson and his girlfriend have recently moved in to his home and they may be able to assist Pt w/ clinical participation in CDT and also in LE self care home program during visit intervals.   Patient is accompained by: Family member   Pertinent History Developmental delay, morbid obesity, OA, OSA (no C pap?,  mitral valve disease, CHF,  Hx DVT, essential hypertension, (malignant) Angina pectoris, hx abnormal EKG, hx TKA   Limitations difficulty walking, decreased standing tolerance, impaired functional mobility and transfers, impaired basic and instrumental ADLs, difficulty performing productive activities and participating in leisure pursuits, difficulty  with social participation   Patient Stated Goals get the sweelling in my legs down   Currently  in Pain? No/denies           [facility] OT Assessment - 06/17/17 0001      Assessment   Referring Provider Marden Noble, MD   Onset Date 10/01/06   Prior Therapy no CDT, has broken pump- not in use, difficulty tolerating and donning / dioffing off-the-shelf elastic compression stockins     Precautions   Precautions Other (comment)  LYMPHEDEMA PRECAUTIONs: CHF, DM, INFECTION RISK, FALL RISK     Balance Screen   Has the patient fallen in the past 6 months Yes   How many times? --  multiple x by family member report, but number unknown     Home  Environment   Family/patient expects to be discharged to: Private residence   Living Arrangements Other relatives   Available Help at Discharge Other (Comment)  stepson and girlfriend recently moved into residence   Type of Home Other (Comment)   Home Access Other (Comment)   Home Layout --  relatives assist w/ some IADLs, but not self care and transp   Bathroom Shower/Tub Tub/Shower unit;Other (comment)  not using shower and bathing infrequently by CG report   Shower/tub characteristics Curtain   Additional Comments sleeps in recliner   Lives With Son;Friend(s)     Prior Function   Level of Independence Independent with household mobility with device;Needs assistance with ADLs;Needs assistance with homemaking;Needs assistance with gait;Needs assistance with transfers   Vocation On disability   Leisure sedentary lifestyle- watches TV in lift  chair     Mobility   Mobility Status History of  falls     Activity Tolerance   Activity Tolerance Comments --  limited by impaired standing tolerance and balance     ROM / Strength   AROM / PROM / Strength --  WFL                  OT Treatments/Exercises (OP) - 06/17/17 0001      Transfers   Transfers Sit to Stand;Stand to Sit   Sit to Stand Other (comment);5: Supervision;With upper extremity assist  from sfirm clinic plinth w/ hips and knees at 90 degrees   Stand to Sit  With upper extremity assist;Uncontrolled descent     ADLs   Overall ADLs According to family member Pt bathing infrequently and has urinarty incontinence, whic has contributed to recurrent cellulitis. Pt  has appropriate bathroom equipment , but no longer transfers into tub to bathe. Pt with difficulty reaching feet and distal legs to bathe and sress. Unable to donn and doff compression  stockings. Unable to inspect skin and perform skin care in keeping w/ DM precautions    Functional Mobility frequent falls   Cooking needs assistance- light meals only   Home Maintenance needs assistance   Financial Management needs assistance   Driving does not have licence   ADL Education Given Yes     Manual Therapy   Manual Therapy Edema management       BLE LYMPHEDEMA, SEVERE, STAGE  II 2/2  Suspected CVI, Morbid Obesity, dependent positioning, sedentary lifestyle. E  Skin Description Hyper-Keratosis [place]au' de Orange Shiny Tight Fibrotic Fatty Doughy Indurated   x x x x x x  x   Hydration Dry Flaky Erythema Other   x x x Non pitting   Color Redness Present Pallor Blanching Hemosiderin Staining Other   x  x x    Odor Malodorous Yeast Present Absent   X- mild      Temperature Warm Cool Normal   X mild     Pitting Edema 1+ 2+ 3+ 4+ >4          Girth Symmetrical Asymmetrical Other Distribution    X  L>R Toes to below knees  (A-D)   Stemmer Sign Positive Negative   Strong +    Lymphorrea History Of:  Present Absent   x     Wounds History Of (when) Present Absent Venous Arterial Pressure Size   x   ?      Signs of Infection Redness Warmth Erythema Acute Swelling Drainage                    Scars Adhesions Hypersensitivity        Sensation Light Touch Deep pressure Hypersensitivty   Present Absent Present Absent Present Absent       DM periferal neuropathy       Nails WNL Fungus Present Other    x    Hair Growth Symmetrical Asymmetrical   x                                      OT Long Term Goals - 06/17/17 1704      OT LONG TERM GOAL #1   Title Pt independent w/ lymphedema precautions and prevention principals and strategies to limit LE progression and infection risk.   Baseline dependent   Time 2   Period Weeks   Status New   Target Date --  14th day of CDT to LLE , and then RLE subsequently     OT LONG TERM GOAL #2   Title Lymphedema (LE) management/ self-care: Pt able to apply knee length, multi layered, gradient compression wraps with maximum caregiver assistance, to one leg at a time, using proper techniques within 2 weeks to achieve optimal limb volume reductions bilaterally.   Baseline dependent   Time 2   Period Days   Status New   Target Date --  14th day of CDT to LLE , and then RLE subsequently     OT LONG TERM GOAL #4   Title Lymphedema (LE) management/ self-care:  Pt to achieve at least 10% limb volume reduction  bilaterally below the knees during Intensive Phase CDT to limit progression, to reduce leg pain, to improve ADLs performance, and to facilitate safe functional mobility and ambulation.   Baseline dependent   Time 12   Period Weeks  per limb   Status New   Target Date 09/05/17  for LLE first-repeat for RLE     OT LONG TERM GOAL #5   Title Lymphedema (LE) management/ self-care:  Pt to tolerate daily compression wraps, compression garments and/ or HOS devices in keeping w/ prescribed wear regime within 1 week of issue date of each to progress and retain clinical and functional gains and to limit LE progression.   Baseline dependent   Time 12   Period Weeks   Status New   Target Date 09/05/17  for LLE first-repeat for RLE               Plan - 06/17/17 1657    Clinical Impression Statement Pt presents with chronic, progressive, severe, stage II/ mild stage III, BLE lymphedema (LE), L>R secondary to suspected CVI, morbid obesity, dependent positioning and sedentary lifestyle. Systemic fluid  retention may also be a contributing factor to leg swelling and discomfort.  Lymphatic congestion significantly elevates risk of skin infection, and non-healing wounds, and contributes to difficulty walking and completing safe transfers, decreased balance and falls risk. BLE lymphedema and associated pain / discomfort limits Pt's functional performance in all occupational domains, including basic and instrumental ADLs, productive activities and leisure pursuits,  and social participation at home and in the community. Skilled Occupational Therapy for LE care is medically necessary to reduce uncontrolled leg swelling and associated pain, to decrease risk of recurrent infections, to limit worsening skin complications, to improve safe ambulation and functional mobility, and to increase functional performance with basic and instrumental ADLs. Without skilled Occupational Therapy for Intensive and Management phase Complete Decongestive Therapy (CDT) and ADL training for lymphedema self-care, LE will continue to progress and further functional decline is expected.   Occupational performance deficits (Please refer to evaluation for details): ADL's;IADL's;Rest and Sleep;Work;Leisure;Social Participation   Rehab Potential Good  Without assistance consistent, diligent, Max A with clinical CDT, including transportation to clinic for optimal attendance,  and with all aspects of LE self care home program, prognosis is poor.    OT Frequency 3x / week   OT Duration Other (comment)  CDT to one leg at a time to limit falls risk, LLE first. Once garments fit, complete CDT to LLE   OT Treatment/Interventions Self-care/ADL training;Therapeutic exercise;Functional Mobility Training;Patient/family education;Manual Therapy;Manual lymph drainage;Therapeutic exercises;DME and/or AE instruction;Compression bandaging;Therapeutic activities   Clinical Decision Making Multiple treatment options, significant modification of task necessary    Recommended Other Services fit with BLE custom compression knee highs and HOS devices that are easy to  don and doff with assistance, arev sturdy and easy to launder   Consulted and Agree with Plan of Care Patient;Family member/caregiver      Patient will benefit from skilled therapeutic intervention in order to improve the following deficits and impairments:  Abnormal gait, Decreased skin integrity, Decreased knowledge of precautions, Decreased activity tolerance, Decreased knowledge of use of DME, Impaired flexibility, Decreased balance, Decreased mobility, Difficulty walking, Impaired sensation, Obesity, Decreased cognition, Decreased range of motion, Increased edema, Pain  Visit Diagnosis: Lymphedema, not elsewhere classified - Plan: Ot plan of care cert/re-cert    Problem List Patient Active Problem List   Diagnosis Date Noted  . 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

## 2017-07-08 ENCOUNTER — Ambulatory Visit: Payer: Medicare Other | Admitting: Occupational Therapy

## 2017-07-09 ENCOUNTER — Inpatient Hospital Stay
Admit: 2017-07-09 | Discharge: 2017-07-09 | Disposition: A | Discharge status: Home / Self Care | Payer: Medicare Other | Attending: Internal Medicine | Admitting: Internal Medicine

## 2017-07-09 ENCOUNTER — Inpatient Hospital Stay
Admission: EM | Admit: 2017-07-09 | Discharge: 2017-07-11 | DRG: 291 | Disposition: A | Discharge status: Home Health | Payer: Medicare Other | Attending: Internal Medicine | Admitting: Internal Medicine

## 2017-07-09 ENCOUNTER — Emergency Department: Payer: Medicare Other

## 2017-07-09 ENCOUNTER — Encounter: Payer: Self-pay | Admitting: *Deleted

## 2017-07-09 DIAGNOSIS — Z23 Encounter for immunization: Secondary | ICD-10-CM

## 2017-07-09 DIAGNOSIS — I5031 Acute diastolic (congestive) heart failure: Secondary | ICD-10-CM | POA: Diagnosis present

## 2017-07-09 DIAGNOSIS — L8990 Pressure ulcer of unspecified site, unspecified stage: Secondary | ICD-10-CM | POA: Insufficient documentation

## 2017-07-09 DIAGNOSIS — L89152 Pressure ulcer of sacral region, stage 2: Secondary | ICD-10-CM | POA: Diagnosis present

## 2017-07-09 DIAGNOSIS — I509 Heart failure, unspecified: Secondary | ICD-10-CM

## 2017-07-09 DIAGNOSIS — L0390 Cellulitis, unspecified: Secondary | ICD-10-CM | POA: Insufficient documentation

## 2017-07-09 DIAGNOSIS — Z8249 Family history of ischemic heart disease and other diseases of the circulatory system: Secondary | ICD-10-CM | POA: Diagnosis not present

## 2017-07-09 DIAGNOSIS — I130 Hypertensive heart and chronic kidney disease with heart failure and stage 1 through stage 4 chronic kidney disease, or unspecified chronic kidney disease: Principal | ICD-10-CM | POA: Diagnosis present

## 2017-07-09 DIAGNOSIS — I890 Lymphedema, not elsewhere classified: Secondary | ICD-10-CM | POA: Diagnosis present

## 2017-07-09 DIAGNOSIS — N183 Chronic kidney disease, stage 3 (moderate): Secondary | ICD-10-CM | POA: Diagnosis present

## 2017-07-09 DIAGNOSIS — Z66 Do not resuscitate: Secondary | ICD-10-CM | POA: Diagnosis present

## 2017-07-09 DIAGNOSIS — R0602 Shortness of breath: Secondary | ICD-10-CM | POA: Diagnosis not present

## 2017-07-09 HISTORY — DX: Chronic kidney disease, unspecified: N18.9

## 2017-07-09 HISTORY — DX: Lymphedema, not elsewhere classified: I89.0

## 2017-07-09 LAB — COMPREHENSIVE METABOLIC PANEL
ALBUMIN: 3.3 g/dL — AB (ref 3.5–5.0)
ALT: 13 U/L — ABNORMAL LOW (ref 17–63)
ANION GAP: 9 (ref 5–15)
AST: 18 U/L (ref 15–41)
Alkaline Phosphatase: 82 U/L (ref 38–126)
BILIRUBIN TOTAL: 0.7 mg/dL (ref 0.3–1.2)
BUN: 22 mg/dL — ABNORMAL HIGH (ref 6–20)
CO2: 31 mmol/L (ref 22–32)
Calcium: 9.7 mg/dL (ref 8.9–10.3)
Chloride: 103 mmol/L (ref 101–111)
Creatinine, Ser: 1.71 mg/dL — ABNORMAL HIGH (ref 0.61–1.24)
GFR calc non Af Amer: 36 mL/min — ABNORMAL LOW (ref >60)
GFR, EST AFRICAN AMERICAN: 42 mL/min — AB (ref >60)
GLUCOSE: 141 mg/dL — AB (ref 65–99)
POTASSIUM: 3.7 mmol/L (ref 3.5–5.1)
SODIUM: 143 mmol/L (ref 135–145)
TOTAL PROTEIN: 6.9 g/dL (ref 6.5–8.1)

## 2017-07-09 LAB — CBC WITH DIFFERENTIAL/PLATELET
BASOS ABS: 0 10*3/uL (ref 0–0.1)
BASOS PCT: 1 %
EOS ABS: 0.5 10*3/uL (ref 0–0.7)
EOS PCT: 6 %
HEMATOCRIT: 37.6 % — AB (ref 40.0–52.0)
Hemoglobin: 12.3 g/dL — ABNORMAL LOW (ref 13.0–18.0)
Lymphocytes Relative: 32 %
Lymphs Abs: 2.5 10*3/uL (ref 1.0–3.6)
MCH: 28 pg (ref 26.0–34.0)
MCHC: 32.8 g/dL (ref 32.0–36.0)
MCV: 85.3 fL (ref 80.0–100.0)
MONO ABS: 0.5 10*3/uL (ref 0.2–1.0)
Monocytes Relative: 7 %
NEUTROS ABS: 4.3 10*3/uL (ref 1.4–6.5)
Neutrophils Relative %: 54 %
PLATELETS: 160 10*3/uL (ref 150–440)
RBC: 4.41 MIL/uL (ref 4.40–5.90)
RDW: 15.5 % — AB (ref 11.5–14.5)
WBC: 7.8 10*3/uL (ref 3.8–10.6)

## 2017-07-09 LAB — TROPONIN I
TROPONIN I: 0.03 ng/mL — AB (ref <0.03)
Troponin I: 0.03 ng/mL (ref <0.03)
Troponin I: 0.03 ng/mL (ref <0.03)
Troponin I: 0.03 ng/mL (ref <0.03)

## 2017-07-09 LAB — GLUCOSE, CAPILLARY: Glucose-Capillary: 270 mg/dL — ABNORMAL HIGH (ref 65–99)

## 2017-07-09 LAB — BRAIN NATRIURETIC PEPTIDE: B Natriuretic Peptide: 219 pg/mL — ABNORMAL HIGH (ref 0.0–100.0)

## 2017-07-09 MED ORDER — FUROSEMIDE 10 MG/ML IJ SOLN
20.0000 mg | Freq: Three times a day (TID) | INTRAMUSCULAR | Status: DC
  Administered 2017-07-09 – 2017-07-11 (×6): 20 mg via INTRAVENOUS
  Filled 2017-07-09 (×2): qty 4
  Filled 2017-07-09 (×4): qty 2

## 2017-07-09 MED ORDER — LORATADINE 10 MG PO TABS: 10.0000 mg | ORAL_TABLET | Freq: Every day | ORAL | Status: DC | PRN

## 2017-07-09 MED ORDER — FUROSEMIDE 10 MG/ML IJ SOLN
60.0000 mg | Freq: Once | INTRAMUSCULAR | Status: AC
Start: 2017-07-09 — End: 2017-07-09
  Administered 2017-07-09: 60 mg via INTRAVENOUS
  Filled 2017-07-09: qty 8

## 2017-07-09 MED ORDER — VITAMIN B-12 1000 MCG PO TABS
1000.0000 ug | ORAL_TABLET | Freq: Every day | ORAL | Status: DC
  Administered 2017-07-10 – 2017-07-11 (×2): 1000 ug via ORAL
  Filled 2017-07-09 (×2): qty 1

## 2017-07-09 MED ORDER — HEPARIN SODIUM (PORCINE) 5000 UNIT/ML IJ SOLN
5000.0000 [IU] | Freq: Three times a day (TID) | INTRAMUSCULAR | Status: DC
  Administered 2017-07-09 – 2017-07-11 (×6): 5000 [IU] via SUBCUTANEOUS
  Filled 2017-07-09 (×7): qty 1

## 2017-07-09 MED ORDER — AMLODIPINE BESYLATE 5 MG PO TABS
5.0000 mg | ORAL_TABLET | Freq: Every day | ORAL | Status: DC
  Administered 2017-07-10 – 2017-07-11 (×2): 5 mg via ORAL
  Filled 2017-07-09 (×2): qty 1

## 2017-07-09 MED ORDER — INFLUENZA VAC SPLIT HIGH-DOSE 0.5 ML IM SUSY
0.5000 mL | PREFILLED_SYRINGE | INTRAMUSCULAR | Status: AC
  Administered 2017-07-10: 0.5 mL via INTRAMUSCULAR
  Filled 2017-07-09: qty 0.5

## 2017-07-09 MED ORDER — FINASTERIDE 5 MG PO TABS
5.0000 mg | ORAL_TABLET | Freq: Every day | ORAL | Status: DC
  Administered 2017-07-10 – 2017-07-11 (×2): 5 mg via ORAL
  Filled 2017-07-09 (×2): qty 1

## 2017-07-09 MED ORDER — METHYLPREDNISOLONE SODIUM SUCC 125 MG IJ SOLR
125.0000 mg | Freq: Once | INTRAMUSCULAR | Status: AC
  Administered 2017-07-09 (×2): 125 mg via INTRAVENOUS

## 2017-07-09 MED ORDER — BENAZEPRIL HCL 20 MG PO TABS
20.0000 mg | ORAL_TABLET | Freq: Every day | ORAL | Status: DC
  Administered 2017-07-10 – 2017-07-11 (×2): 20 mg via ORAL
  Filled 2017-07-09 (×2): qty 1

## 2017-07-09 MED ORDER — DOCUSATE SODIUM 100 MG PO CAPS: 100.0000 mg | ORAL_CAPSULE | Freq: Two times a day (BID) | ORAL | Status: DC | PRN

## 2017-07-09 MED ORDER — HYDROCERIN EX CREA
TOPICAL_CREAM | Freq: Two times a day (BID) | CUTANEOUS | Status: DC
  Administered 2017-07-09 – 2017-07-11 (×4): via TOPICAL
  Filled 2017-07-09: qty 113

## 2017-07-09 MED ORDER — PNEUMOCOCCAL VAC POLYVALENT 25 MCG/0.5ML IJ INJ
0.5000 mL | INJECTION | INTRAMUSCULAR | Status: AC
Start: 2017-07-10 — End: 2017-07-10
  Administered 2017-07-10: 0.5 mL via INTRAMUSCULAR
  Filled 2017-07-09: qty 0.5

## 2017-07-09 MED ORDER — METHYLPREDNISOLONE SODIUM SUCC 125 MG IJ SOLR
INTRAMUSCULAR | Status: AC
  Administered 2017-07-09: 125 mg via INTRAVENOUS
  Filled 2017-07-09: qty 2

## 2017-07-09 MED ORDER — CALCIUM CARBONATE ANTACID 500 MG PO CHEW: 1.0000 | CHEWABLE_TABLET | Freq: Three times a day (TID) | ORAL | Status: DC | PRN

## 2017-07-09 NOTE — ED Triage Notes (Signed)
Pt from home via EMS for shortness of breath, pt reports shortness of breath started last night, pt denies pain or any other symptoms, pt reports dry cough, wheezing noted, pt received 1 duoneb from EMS

## 2017-07-09 NOTE — Evaluation (Signed)
Physical Therapy Evaluation Patient Details Name: James Collins MRN: [id number] DOB: November 23, 1934 Today's Date: 07/09/2017   History of Present Illness  Pt is an 82 y.o. male presenting to hospital with SOB for a few days.  Pt admitted with acute diastolic CHF, htn, chronic lymphedema, and CKD stage 3.  PMH includes htn, chronic LE lymphedema, rhabdomyoma, and CKD stage 3.  Clinical Impression  Prior to hospital admission, pt was ambulating short distances (20-25 feet) mod I with walker; otherwise used manual w/c (modified independent propelling with UE's).  Pt lives in a 1 level home with ramp to enter and pt's stepson and stepson's girlfriend live with pt (assist with meals).  Currently pt is min to mod assist with standing from recliner chair (pt using momentum and increased effort) and CGA to ambulate 20 feet with RW.  Nursing cleared PT to trial pt on room air.  Pt's O2 92-93% at rest on room air but decreased to 85% after 20 feet of ambulation so further ambulation deferred.  O2 increased back to 96% on 2 L O2 quickly with sitting rest break.  Pt would benefit from skilled PT to address noted impairments and functional limitations (see below for any additional details).  Upon hospital discharge, recommend pt discharge to home with HHPT and support of family.    Follow Up Recommendations Home health PT    Equipment Recommendations  Rolling walker with 5" wheels;Wheelchair (measurements PT) (pt has RW and w/c at home already)    Recommendations for Other Services OT consult     Precautions / Restrictions Precautions Precautions: Fall Restrictions Weight Bearing Restrictions: No      Mobility  Bed Mobility               General bed mobility comments: Deferred d/t pt sitting up in chair beginning and end of session.  Transfers Overall transfer level: Needs assistance Equipment used: Rolling walker (2 wheeled) Transfers: Sit to/from Stand Sit to Stand: Mod assist;Min  assist         General transfer comment: mod assist first attempt standing; min assist 2nd trial standing; vc's for LE positioning; increased effort and time to perform (pt using momentum to assist)  Ambulation/Gait Ambulation/Gait assistance: Min guard (chair follow) Ambulation Distance (Feet): 20 Feet Assistive device:  (Bari RW)   Gait velocity: decreased   General Gait Details: decreased B step length; vc's to stay closer to RW; distance limited d/t SOB  Stairs            Wheelchair Mobility    Modified Rankin (Stroke Patients Only)       Balance Overall balance assessment: Needs assistance Sitting-balance support: No upper extremity supported;Feet supported Sitting balance-Leahy Scale: Good Sitting balance - Comments: sitting reaching within BOS   Standing balance support: Bilateral upper extremity supported (on RW) Standing balance-Leahy Scale: Poor Standing balance comment: requires UE support on RW for static balance in standing                             Pertinent Vitals/Pain Pain Assessment: Faces Faces Pain Scale: No hurt Pain Intervention(s): Limited activity within patient's tolerance;Monitored during session;Repositioned  HR WFL during session.    Home Living Family/patient expects to be discharged to:: Private residence Living Arrangements: Children;Other relatives (pt's stepson and stepson's girlfriend) Available Help at Discharge: Family Type of Home: House Home Access: Ramped entrance     Home Layout: One level Home Equipment: Gilford Rile -  2 wheels;Wheelchair - manual;Tub bench      Prior Function Level of Independence: Needs assistance      ADL's / Homemaking Assistance Needed: Assist for meals.  Sits on tub bench for bathing in shower (mod I).  Comments: Pt uses manual w/c mostly but uses walker for ambulating in/out of bathroom (about 25 feet distance at a time).  Pt has [employer] in home.     Hand Dominance         Extremity/Trunk Assessment   Upper Extremity Assessment Upper Extremity Assessment: Generalized weakness    Lower Extremity Assessment Lower Extremity Assessment: Generalized weakness       Communication   Communication: No difficulties  Cognition Arousal/Alertness: Awake/alert Behavior During Therapy: WFL for tasks assessed/performed Overall Cognitive Status: Within Functional Limits for tasks assessed                                        General Comments General comments (skin integrity, edema, etc.): B LE lymphedema noted.  Nursing cleared pt for participation in physical therapy.  Pt agreeable to PT session.    Exercises     Assessment/Plan    PT Assessment Patient needs continued PT services  PT Problem List Decreased strength;Decreased activity tolerance;Decreased balance;Decreased mobility;Cardiopulmonary status limiting activity       PT Treatment Interventions DME instruction;Gait training;Functional mobility training;Therapeutic activities;Therapeutic exercise;Balance training;Patient/family education    PT Goals (Current goals can be found in the Care Plan section)  Acute Rehab PT Goals Patient Stated Goal: to go home PT Goal Formulation: With patient Time For Goal Achievement: 07/23/17 Potential to Achieve Goals: Fair    Frequency Min 2X/week   Barriers to discharge        Co-evaluation               AM-PAC PT "6 Clicks" Daily Activity  Outcome Measure Difficulty turning over in bed (including adjusting bedclothes, sheets and blankets)?: A Lot Difficulty moving from lying on back to sitting on the side of the bed? : A Lot Difficulty sitting down on and standing up from a chair with arms (e.g., wheelchair, bedside commode, etc,.)?: Unable Help needed moving to and from a bed to chair (including a wheelchair)?: A Lot Help needed walking in hospital room?: A Little Help needed climbing 3-5 steps with a railing? : A Lot 6 Click  Score: 12    End of Session Equipment Utilized During Treatment: Gait belt;Oxygen Activity Tolerance: Other (comment) (Limited d/t SOB with activity) Patient left: in chair;with call bell/phone within reach;with chair alarm set Nurse Communication: Mobility status;Precautions PT Visit Diagnosis: Other abnormalities of gait and mobility (R26.89);Muscle weakness (generalized) (M62.81)    Time: 1540-1600 PT Time Calculation (min) (ACUTE ONLY): 20 min   Charges:   PT Evaluation $PT Eval Low Complexity: 1 Low     PT G Codes:   PT G-Codes **NOT FOR INPATIENT CLASS** Functional Assessment Tool Used: AM-PAC 6 Clicks Basic Mobility Functional Limitation: Mobility: Walking and moving around Mobility: Walking and Moving Around Current Status (C1660): At least 60 percent but less than 80 percent impaired, limited or restricted Mobility: Walking and Moving Around Goal Status [phone]): At least 40 percent but less than 60 percent impaired, limited or restricted    Leitha Bleak, PT 07/10/17, 9:04 AM [phone]

## 2017-07-09 NOTE — Progress Notes (Signed)
Notified Dr Marthann Schiller of pt CBG of 270, awaiting signed orders for sensitive sliding scale insulin and fingersticks. Pt placed on 2 L acute oxygen via Nasal cannula due to dyspnea upon exertion.

## 2017-07-09 NOTE — Consult Note (Signed)
[place] Nurse wound consult note Reason for Consult: Moisture associated skin damage (MASD) sacrum, perineal skin and subpannicular folds Chronic lymphedema in the setting of acute CHF exacerbation Wound type: moisture and chronic lymphedema Pressure Injury POA: NA Measurement: sacrum:  2 cm x 2 cm x 0.1 cm partial thickness injury to skin Generalized erythema and scattered denuded skin to perineal and scrotal skin.  Incontinent of urine Legs with bilateral edema, chronic lymphedema.  Wears no compression, he indicates  Will not compress at this time due to shortness of breath and exacerbation of CHF.   Denuded skin and erythema to abdominal pannus, fungal odor Wound JJO:ACZY and moist Drainage (amount, consistency, odor) scant serous weeping Periwound:chronic skin changes to bilateral lower legs, will add moisture barrier cream.   Dressing procedure/placement/frequency:Interdry Ag to abdominal pannus:  Measure and cut length of InterDry Ag+ to fit in skin folds that have skin breakdown Tuck InterDry  Ag+ fabric into skin folds in a single layer, allow for 2 inches of overhang from skin edges to allow for wicking to occur May remove to bathe; dry area thoroughly and then tuck into affected areas again Do not apply any creams or ointments when using InterDry Ag+ DO NOT THROW AWAY FOR 5 DAYS unless soiled with stool DO NOT [facility] product, this will inactivate the silver in the material  New sheet of Interdry Ag+ should be applied after 5 days of use if patient continues to have skin breakdown   Eucerin cream to bilateral legs.  Gerhardt's butt cream to perineal skin/buttocks twice daily.  Will not follow at this time.  Please re-consult if needed.  Domenic Moras RN BSN [place] Pager [phone]

## 2017-07-09 NOTE — H&P (Signed)
[facility] [place] [facility] [place] NAME: James Collins    MR#:  [id number]  DATE OF BIRTH:  [birth year]  DATE OF ADMISSION:  07/09/2017  PRIMARY CARE PHYSICIAN: Marden Noble, MD   REQUESTING/REFERRING PHYSICIAN: McShane  CHIEF COMPLAINT:   Chief Complaint  Patient presents with  . Shortness of Breath    HISTORY OF PRESENT ILLNESS: James Collins  is a 82 y.o. male with a known history of CKD stage 3, Htn, Lymphedema- have worsening Lymphedema for last few weeks, his foot pump is broken. Getting generalized weakness and Orthopnea. Had some chills and not feeling well, so came to ER> Noted to have Pulm edema on Xray chest. A baseline- gradual decline in is working ability- just walks from room to bathroom with a walker.  PAST MEDICAL HISTORY:   Past Medical History:  Diagnosis Date  . Chronic kidney disease   . Hypertension   . Lymphedema     PAST SURGICAL HISTORY: History reviewed. No pertinent surgical history.  SOCIAL HISTORY:  Social History  Substance Use Topics  . Smoking status: Never Smoker  . Smokeless tobacco: Never Used  . Alcohol use No    FAMILY HISTORY:  Family History  Problem Relation Age of Onset  . CAD Father     DRUG ALLERGIES: No Known Allergies  REVIEW OF SYSTEMS:   CONSTITUTIONAL: No fever, fatigue or weakness.  EYES: No blurred or double vision.  EARS, NOSE, AND THROAT: No tinnitus or ear pain.  RESPIRATORY: No cough,have shortness of breath,no wheezing or hemoptysis.  CARDIOVASCULAR: No chest pain, orthopnea, edema.  GASTROINTESTINAL: No nausea, vomiting, diarrhea or abdominal pain.  GENITOURINARY: No dysuria, hematuria.  ENDOCRINE: No polyuria, nocturia,  HEMATOLOGY: No anemia, easy bruising or bleeding SKIN: No rash or lesion. MUSCULOSKELETAL: No joint pain or arthritis.   NEUROLOGIC: No tingling, numbness, weakness.  PSYCHIATRY: No anxiety or depression.   MEDICATIONS AT HOME:  Prior to Admission  medications   Medication Sig Start Date End Date Taking? Authorizing Provider  amLODipine (NORVASC) 5 MG tablet Take 5 mg by mouth daily.    Yes [provider]  benazepril (LOTENSIN) 20 MG tablet Take 20 mg by mouth daily.    Yes [provider]  calcium carbonate (TUMS - DOSED IN MG ELEMENTAL CALCIUM) 500 MG chewable tablet Chew 1 tablet by mouth 3 (three) times daily as needed for indigestion or heartburn.   Yes [provider]  finasteride (PROSCAR) 5 MG tablet Take 5 mg by mouth daily.    Yes [provider]  loratadine (CLARITIN) 10 MG tablet Take 1 tablet by mouth daily as needed for allergies.    Yes [provider]  vitamin B-12 (CYANOCOBALAMIN) 1000 MCG tablet Take 1,000 mcg by mouth daily.   Yes [provider]      PHYSICAL EXAMINATION:   VITAL SIGNS: Blood pressure (!) 186/85, pulse 80, temperature 98.1 F (36.7 C), temperature source Oral, resp. rate (!) 28, height 5\' 8"  (1.727 m), weight 136.1 kg (300 lb), SpO2 91 %.  GENERAL:  82 y.o.-year-old patient lying in the bed with no acute distress.  EYES: Pupils equal, round, reactive to light and accommodation. No scleral icterus. Extraocular muscles intact.  HEENT: Head atraumatic, normocephalic. Oropharynx and nasopharynx clear. Hard of hearing. NECK:  Supple, no jugular venous distention. No thyroid enlargement, no tenderness.  LUNGS: Normal breath sounds bilaterally, no wheezing, have crepitation. No use of accessory muscles of respiration.  CARDIOVASCULAR: S1,  S2 normal. No murmurs, rubs, or gallops.  ABDOMEN: Soft, nontender, nondistended. Bowel sounds present. No organomegaly or mass.  EXTREMITIES: severe edema with chronic thickening and scaling on skin on both legs, with localized redness on right leg side. NEUROLOGIC: Cranial nerves II through XII are intact. Muscle strength 4-5/5 in all extremities. Sensation intact. Gait not checked.  PSYCHIATRIC: The patient is  alert and oriented x 3.  SKIN: No obvious rash, lesion, or ulcer.   LABORATORY PANEL:   CBC  Recent Labs Lab 07/09/17 0728  WBC 7.8  HGB 12.3*  HCT 37.6*  PLT 160  MCV 85.3  MCH 28.0  MCHC 32.8  RDW 15.5*  LYMPHSABS 2.5  MONOABS 0.5  EOSABS 0.5  BASOSABS 0.0   ------------------------------------------------------------------------------------------------------------------  Chemistries   Recent Labs Lab 07/09/17 0728  NA 143  K 3.7  CL 103  CO2 31  GLUCOSE 141*  BUN 22*  CREATININE 1.71*  CALCIUM 9.7  AST 18  ALT 13*  ALKPHOS 82  BILITOT 0.7   ------------------------------------------------------------------------------------------------------------------ estimated creatinine clearance is 46.5 mL/min (A) (by C-G formula based on SCr of 1.71 mg/dL (H)). ------------------------------------------------------------------------------------------------------------------ No results for input(s): TSH, T4TOTAL, T3FREE, THYROIDAB in the last 72 hours.  Invalid input(s): FREET3   Coagulation profile No results for input(s): INR, PROTIME in the last 168 hours. ------------------------------------------------------------------------------------------------------------------- No results for input(s): DDIMER in the last 72 hours. -------------------------------------------------------------------------------------------------------------------  Cardiac Enzymes  Recent Labs Lab 07/09/17 0728  TROPONINI 0.03*   ------------------------------------------------------------------------------------------------------------------ Invalid input(s): POCBNP  ---------------------------------------------------------------------------------------------------------------  Urinalysis    Component Value Date/Time   COLORURINE AMBER (A) 02/16/2016 1235   APPEARANCEUR CLOUDY (A) 02/16/2016 1235   LABSPEC 1.025 02/16/2016 1235   PHURINE 5.0 02/16/2016 1235   GLUCOSEU 150  (A) 02/16/2016 1235   HGBUR 3+ (A) 02/16/2016 1235   BILIRUBINUR NEGATIVE 02/16/2016 1235   KETONESUR TRACE (A) 02/16/2016 1235   PROTEINUR >500 (A) 02/16/2016 1235   NITRITE NEGATIVE 02/16/2016 1235   LEUKOCYTESUR NEGATIVE 02/16/2016 1235     RADIOLOGY: Dg Chest 2 View  Result Date: 07/09/2017 CLINICAL DATA:  82 year old hypertensive nonsmoking male with shortness of breath. Initial encounter. EXAM: CHEST  2 VIEW COMPARISON:  02/17/2016. FINDINGS: Cardiomegaly. Pulmonary vascular congestion superimposed upon chronic changes. Cardiac lead suspected causing nodular density right lung. This can be confirmed on follow-up with cardiac leads removed to exclude underlying nodule. Motion degraded lateral view. Scoliosis thoracic spine convex right. IMPRESSION: Cardiomegaly. Pulmonary vascular congestion. Cardiac lead suspected causing nodular density right lung. This can be confirmed on follow-up with cardiac leads removed to exclude underlying nodule. Electronically Signed   By: Genia Del M.D.   On: 07/09/2017 08:34    EKG: Orders placed or performed during the hospital encounter of 07/09/17  . ED EKG  . ED EKG  . EKG 12-Lead  . EKG 12-Lead  . EKG 12-Lead  . EKG 12-Lead    IMPRESSION AND PLAN:  * Ac diastolic CHF   IV lasix, I/o Monitor, Fluid restriction.   Echo cardiogram   Follow serial troponin  * Hypertension    Cont amlodipine and benazepril    On IV lasix also  * Chronic lymphedema   Cont SCD   Follows at lymphedema clinic, cont.   Does not look infected at this time.  * CKD stage 3   Monitor with lasix.  All the records are reviewed and case discussed with ED provider. Management plans discussed with the patient, family and they are in agreement.  CODE STATUS:  DNR Code Status History    Date Active Date Inactive Code Status Order ID Comments User Context   02/16/2016  5:52 PM 02/20/2016  3:59 PM DNR [id number]  Fritzi Mandes, MD Inpatient    Questions for Most  Recent Historical Code Status (Order [id number])    Question Answer Comment   In the event of cardiac or respiratory ARREST Do not call a "code blue"    In the event of cardiac or respiratory ARREST Do not perform Intubation, CPR, defibrillation or ACLS    In the event of cardiac or respiratory ARREST Use medication by any route, position, wound care, and other measures to relive pain and suffering. May use oxygen, suction and manual treatment of airway obstruction as needed for comfort.      His cousine Sheela Stack is POA, was present in room.  TOTAL TIME TAKING CARE OF THIS PATIENT: 45 minutes.    Vaughan Basta M.D on 07/09/2017   Between 7am to 6pm - Pager - [phone]  After 6pm go to [email or web address] - password [email or web address] [facility]  Office  [phone]  CC: Primary care physician; Marden Noble, MD   Note: This dictation was prepared with Dragon dictation along with smaller phrase technology. Any transcriptional errors that result from this process are unintentional.

## 2017-07-09 NOTE — ED Provider Notes (Signed)
[facility] Emergency Department Provider Note  ____________________________________________   I have reviewed the triage vital signs and the nursing notes.   HISTORY  Chief Complaint Shortness of Breath    HPI James Collins is a 82 y.o. male with a history of hypertension, lymphedema in his legs which is chronic, no known history of CHF he states, presents today complaining of shortness of breath last few days. He cannot comment on orthopnea because he can never lie flat, he states that he has been coughing a little bit but not productive, denies fever, patient is somewhat limited historian. Level 5 chart caveat; no furtherdefinitive history available due to patient status.EMS reports the patient was satting in the low 90s on room air and they gave him albuterol nebs 2 on the way in for wheezing and he seemed to improve. He states he feels somewhat better. He denies chest pain although he's had some "burping" in the last couple weeks. Denies abdominal pain. He denies productive cough at this time.  he cannot Tommy if the lower extremity swelling is worse or better. He is at his baseline per family who just arrived   Past Medical History:  Diagnosis Date  . Hypertension     Patient Active Problem List   Diagnosis Date Noted  . 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 No pertinent surgical history.  Prior to Admission medications   Medication Sig Start Date End Date Taking? Authorizing Provider  amLODipine (NORVASC) 5 MG tablet Take 1 tablet by mouth daily. 02/01/16   [provider]  benazepril (LOTENSIN) 20 MG tablet Take 1 tablet by mouth daily. 02/01/16   [provider]  finasteride (PROSCAR) 5 MG tablet Take 1 tablet by mouth daily. 02/01/16   [provider]  loratadine (CLARITIN) 10 MG tablet Take 1 tablet by mouth daily as needed for allergies.     [provider]    Allergies Patient has no known  allergies.  No family history on file.  Social History Social History  Substance Use Topics  . Smoking status: Never Smoker  . Smokeless tobacco: Never Used  . Alcohol use No    Review of Systems Constitutional: No fever/chills Eyes: No visual changes. ENT: positivesore throat. No stiff neck no neck pain Cardiovascular: Denies chest pain. Respiratory: Denies shortness of breath. Gastrointestinal:   no vomiting.  No diarrhea.  No constipation. Genitourinary: Negative for dysuria. Musculoskeletal: Negative lower extremity swelling Skin: Negative for rash. Neurological: Negative for severe headaches, focal weakness or numbness.   ____________________________________________   PHYSICAL EXAM:  VITAL SIGNS: ED Triage Vitals  Enc Vitals Group     BP 07/09/17 0730 (!) 197/97     Pulse Rate 07/09/17 0730 79     Resp 07/09/17 0730 (!) 22     Temp 07/09/17 0730 98.1 F (36.7 C)     Temp Source 07/09/17 0730 Oral     SpO2 07/09/17 0730 94 %     Weight 07/09/17 0731 300 lb (136.1 kg)     Height 07/09/17 0731 5\' 8"  (1.727 m)     Head Circumference --      Peak Flow --      Pain Score --      Pain Loc --      Pain Edu? --      Excl. in [place]? --     Constitutional: Alert and oriented. Well appearing and in no acute distress. Eyes: Conjunctivae are normal Head: Atraumatic HEENT:  No congestion/rhinnorhea. Mucous membranes are moist.  Oropharynx non-erythematous Neck:   Nontender with no meningismus, no masses, no stridor Cardiovascular: Normal rate, regular rhythm. Grossly normal heart sounds.  Good peripheral circulation. Respiratory: Normal respiratory effort.  No retractions.diffuse mild wheeze noted diminished in the bases obesity limits the exam. Abdominal: Soft and nontender. No distention. No guarding no rebound Back:  There is no focal tenderness or step off.  there is no midline tenderness there are no lesions noted. there is no CVA tenderness Musculoskeletal: No lower  extremity tenderness, no upper extremity tenderness. No joint effusions,bilateral symmetricswelling pitting edema almost chronic in appearance Neurologic:  Normal speech and language. No gross focal neurologic deficits are appreciated.  Skin:  Skin is warm, dry and intact. No rash noted. Psychiatric: Mood and affect are normal. Speech and behavior are normal.  ____________________________________________   LABS (all labs ordered are listed, but only abnormal results are displayed)  Labs Reviewed  CBC WITH DIFFERENTIAL/PLATELET  COMPREHENSIVE METABOLIC PANEL  BRAIN NATRIURETIC PEPTIDE  TROPONIN I    Pertinent labs  results that were available during my care of the patient were reviewed by me and considered in my medical decision making (see chart for details). ____________________________________________  EKG  I personally interpreted any EKGs ordered by me or triage 2 EKGs were performed on this patient to get a sense of the rhythm the first shows rate 77, likely atrial fibrillation, but wandering baseline limits interpretation, no acute ischemic elevations noted, normal axis. Second EKG shows rate 73, again difficult to interpret rhythm, could be sinus with a long PR, normal axis no acute ischemic changes noted in terms of ST elevation or significant ST depression, ____________________________________________  RADIOLOGY  Pertinent labs & imaging results that were available during my care of the patient were reviewed by me and considered in my medical decision making (see chart for details). If possible, patient and/or family made aware of any abnormal findings. ____________________________________________    PROCEDURES  Procedure(s) performed: None  Procedures  Critical Care performed: None  ____________________________________________   INITIAL IMPRESSION / ASSESSMENT AND PLAN / ED COURSE  Pertinent labs & imaging results that were available during my care of the  patient were reviewed by me and considered in my medical decision making (see chart for details).  patient here with cough, wheeze, and shortness of breath. Apparently is much improved after 2 nebulizers on the way in. I'll give him steroids here, he does have an IV, his lungs do show diffuse wheezes concerned they could becardiogenic we'll check troponin, CMP, CBC, BNP, chest x-ray and watch the patient closely.    ____________________________________________   FINAL CLINICAL IMPRESSION(S) / ED DIAGNOSES  Final diagnoses:  None      This chart was dictated using voice recognition software.  Despite best efforts to proofread,  errors can occur which can change meaning.      Schuyler Amor, MD 07/09/17 0800

## 2017-07-09 NOTE — Discharge Instructions (Signed)
Heart Failure Clinic appointment on July 16 2017 at 2:00pm with Darylene Price, [place]. Please call [phone] to reschedule.

## 2017-07-10 ENCOUNTER — Inpatient Hospital Stay: Payer: Medicare Other

## 2017-07-10 LAB — CBC
HCT: 34.6 % — ABNORMAL LOW (ref 40.0–52.0)
HEMOGLOBIN: 11.7 g/dL — AB (ref 13.0–18.0)
MCH: 28.2 pg (ref 26.0–34.0)
MCHC: 34 g/dL (ref 32.0–36.0)
MCV: 82.9 fL (ref 80.0–100.0)
Platelets: 160 10*3/uL (ref 150–440)
RBC: 4.17 MIL/uL — ABNORMAL LOW (ref 4.40–5.90)
RDW: 15.3 % — ABNORMAL HIGH (ref 11.5–14.5)
WBC: 7.5 10*3/uL (ref 3.8–10.6)

## 2017-07-10 LAB — BASIC METABOLIC PANEL
ANION GAP: 8 (ref 5–15)
BUN: 32 mg/dL — ABNORMAL HIGH (ref 6–20)
CALCIUM: 8.7 mg/dL — AB (ref 8.9–10.3)
CO2: 31 mmol/L (ref 22–32)
CREATININE: 2.05 mg/dL — AB (ref 0.61–1.24)
Chloride: 104 mmol/L (ref 101–111)
GFR calc Af Amer: 34 mL/min — ABNORMAL LOW (ref >60)
GFR calc non Af Amer: 29 mL/min — ABNORMAL LOW (ref >60)
GLUCOSE: 192 mg/dL — AB (ref 65–99)
Potassium: 3.6 mmol/L (ref 3.5–5.1)
Sodium: 143 mmol/L (ref 135–145)

## 2017-07-10 LAB — ECHOCARDIOGRAM COMPLETE
Height: 69 in
Weight: 4792 oz

## 2017-07-10 LAB — TSH: TSH: 4.784 u[IU]/mL — AB (ref 0.350–4.500)

## 2017-07-10 MED ORDER — POTASSIUM CHLORIDE CRYS ER 20 MEQ PO TBCR
40.0000 meq | EXTENDED_RELEASE_TABLET | Freq: Two times a day (BID) | ORAL | Status: DC
  Administered 2017-07-10 – 2017-07-11 (×2): 40 meq via ORAL
  Filled 2017-07-10 (×3): qty 2

## 2017-07-10 NOTE — Progress Notes (Signed)
Inpatient Diabetes Program Recommendations  AACE/ADA: New Consensus Statement on Inpatient Glycemic Control (2015)  Target Ranges:  Prepandial:   less than 140 mg/dL      Peak postprandial:   less than 180 mg/dL (1-2 hours)      Critically ill patients:  140 - 180 mg/dL   Results for James Collins, James Collins (MRN [id number]) as of 07/10/2017 14:21  Ref. Range 02/17/2016 04:10  Hemoglobin A1C Latest Ref Range: 4.0 - 6.0 % 7.2 (H)   Results for James Collins, James Collins (MRN [id number]) as of 07/10/2017 14:21  Ref. Range 07/09/2017 17:12  Glucose-Capillary Latest Ref Range: 65 - 99 mg/dL 270 (H)   Review of Glycemic Control  Diabetes history: None  Current orders for Inpatient glycemic control: None  Inpatient Diabetes Program Recommendations:    Patient without a history of DM, however A1c over 1 year ago was 7.2% indicating possible new Diagnosis at that time. Elevated glucose levels on admission.   Please consider obtaining another A1c to determine glucose levels over the past 2-3 months.  Consider CBGs and Novolog Sensitive Correction 0-9 units tid + Novolog HS scale 0-5 units.  Thanks,  Tama Headings RN, MSN, [facility] Inpatient Diabetes Coordinator Team Pager [phone] (8a-5p)

## 2017-07-10 NOTE — Plan of Care (Signed)
Problem: Activity: Goal: Capacity to carry out activities will improve Outcome: Not Progressing Patient oxygen saturations decline to 85% on room air with exertion. Patient able to work with PT but requires 2L O2 to maintain sats >90%. See saturation qualification note.

## 2017-07-10 NOTE — Progress Notes (Signed)
error 

## 2017-07-10 NOTE — Progress Notes (Signed)
[place] [facility] [place] NAME: James Collins    MR#:  [id number]  DATE OF BIRTH:  1935-06-18  SUBJECTIVE:  CHIEF COMPLAINT:   Chief Complaint  Patient presents with  . Shortness of Breath     Came with SOB and leg edema worsening.   Have CHF, feels better after having IV lasix and some diuresis.   REVIEW OF SYSTEMS:   CONSTITUTIONAL: No fever, fatigue or weakness.  EYES: No blurred or double vision.  EARS, NOSE, AND THROAT: No tinnitus or ear pain.  RESPIRATORY: No cough,have shortness of breath,no wheezing or hemoptysis.  CARDIOVASCULAR: No chest pain, orthopnea, edema.  GASTROINTESTINAL: No nausea, vomiting, diarrhea or abdominal pain.  GENITOURINARY: No dysuria, hematuria.  ENDOCRINE: No polyuria, nocturia,  HEMATOLOGY: No anemia, easy bruising or bleeding SKIN: No rash or lesion. MUSCULOSKELETAL: No joint pain or arthritis.   NEUROLOGIC: No tingling, numbness, weakness.  PSYCHIATRY: No anxiety or depression.   ROS  DRUG ALLERGIES:  No Known Allergies  VITALS:  Blood pressure (!) 144/66, pulse 71, temperature 97.8 F (36.6 C), temperature source Oral, resp. rate 18, height 5\' 9"  (1.753 m), weight 134 kg (295 lb 6.4 oz), SpO2 95 %.  PHYSICAL EXAMINATION:   GENERAL:  82 y.o.-year-old patient lying in the bed with no acute distress.  EYES: Pupils equal, round, reactive to light and accommodation. No scleral icterus. Extraocular muscles intact.  HEENT: Head atraumatic, normocephalic. Oropharynx and nasopharynx clear. Hard of hearing. NECK:  Supple, no jugular venous distention. No thyroid enlargement, no tenderness.  LUNGS: Normal breath sounds bilaterally, no wheezing, have crepitation. No use of accessory muscles of respiration.  CARDIOVASCULAR: S1, S2 normal. No murmurs, rubs, or gallops.  ABDOMEN: Soft, nontender, nondistended. Bowel sounds present. No organomegaly or mass.  EXTREMITIES: severe edema with chronic thickening  and scaling on skin on both legs, with localized redness on right leg side. NEUROLOGIC: Cranial nerves II through XII are intact. Muscle strength 4-5/5 in all extremities. Sensation intact. Gait not checked.  PSYCHIATRIC: The patient is alert and oriented x 3.  SKIN: No obvious rash, lesion, or ulcer.  Physical Exam LABORATORY PANEL:   CBC  Recent Labs Lab 07/10/17 0420  WBC 7.5  HGB 11.7*  HCT 34.6*  PLT 160   ------------------------------------------------------------------------------------------------------------------  Chemistries   Recent Labs Lab 07/09/17 0728 07/10/17 0420  NA 143 143  K 3.7 3.6  CL 103 104  CO2 31 31  GLUCOSE 141* 192*  BUN 22* 32*  CREATININE 1.71* 2.05*  CALCIUM 9.7 8.7*  AST 18  --   ALT 13*  --   ALKPHOS 82  --   BILITOT 0.7  --    ------------------------------------------------------------------------------------------------------------------  Cardiac Enzymes  Recent Labs Lab 07/09/17 1343 07/09/17 1741  TROPONINI 0.03* 0.03*   ------------------------------------------------------------------------------------------------------------------  RADIOLOGY:  Dg Chest 2 View  Result Date: 07/10/2017 CLINICAL DATA:  CHF, chronic renal insufficiency. EXAM: CHEST  2 VIEW COMPARISON:  Chest x-ray of July 09, 2017 FINDINGS: The lungs are reasonably well inflated allowing for the lordotic technique. There is no focal infiltrate. The cardiac silhouette is mildly enlarged. The central pulmonary vascularity is prominent but has improved. The trachea is midline. The bony thorax exhibits no acute abnormality. IMPRESSION: Improved appearance of the pulmonary interstitium likely reflects decreasing edema. Persistent mild cardiac enlargement with central pulmonary vascular prominence. No pneumonia. Electronically Signed   By: Eoghan  Martinique M.D.   On: 07/10/2017 07:35   Dg Chest 2 View  Result Date: 07/09/2017 CLINICAL DATA:  82 year old  hypertensive nonsmoking male with shortness of breath. Initial encounter. EXAM: CHEST  2 VIEW COMPARISON:  02/17/2016. FINDINGS: Cardiomegaly. Pulmonary vascular congestion superimposed upon chronic changes. Cardiac lead suspected causing nodular density right lung. This can be confirmed on follow-up with cardiac leads removed to exclude underlying nodule. Motion degraded lateral view. Scoliosis thoracic spine convex right. IMPRESSION: Cardiomegaly. Pulmonary vascular congestion. Cardiac lead suspected causing nodular density right lung. This can be confirmed on follow-up with cardiac leads removed to exclude underlying nodule. Electronically Signed   By: Genia Del M.D.   On: 07/09/2017 08:34    ASSESSMENT AND PLAN:   Principal Problem:   Acute CHF (congestive heart failure) (HCC) Active Problems:   Pressure injury of skin   * Ac diastolic CHF   IV lasix, I/o Monitor, Fluid restriction.   Echo cardiogram   Follow serial troponin- remained stable.  * Hypertension    Cont amlodipine and benazepril    On IV lasix also  * Chronic lymphedema   Cont SCD   Follows at lymphedema clinic, cont.   Does not look infected at this time.  * CKD stage 3   Monitor with lasix.    All the records are reviewed and case discussed with Care Management/Social Workerr. Management plans discussed with the patient, family and they are in agreement.  CODE STATUS: DNR  TOTAL TIME TAKING CARE OF THIS PATIENT: 35 minutes.    POSSIBLE D/C IN 1-2 DAYS, DEPENDING ON CLINICAL CONDITION.   Vaughan Basta M.D on 07/10/2017   Between 7am to 6pm - Pager - [phone]  After 6pm go to [email or web address] - password [email or web address] [facility]  Office  [phone]  CC: Primary care physician; Marden Noble, MD  Note: This dictation was prepared with Dragon dictation along with smaller phrase technology. Any transcriptional errors that result from this process are  unintentional.

## 2017-07-10 NOTE — Progress Notes (Signed)
SATURATION QUALIFICATIONS: (This note is used to comply with regulatory documentation for home oxygen)  Patient Saturations on Room Air at Rest = 91%  Patient Saturations on Room Air while Ambulating = 85%  Patient Saturations on 1 Liters of oxygen while Ambulating = 89%  Please briefly explain why patient needs home oxygen:

## 2017-07-10 NOTE — Progress Notes (Signed)
Physical Therapy Treatment Patient Details Name: James Collins MRN: [id number] DOB: Feb 04, 1935 Today's Date: 07/10/2017    History of Present Illness Pt is an 82 y.o. male presenting to hospital with SOB for a few days.  Pt admitted with acute diastolic CHF, htn, chronic lymphedema, and CKD stage 3.  PMH includes htn, chronic LE lymphedema, rhabdomyoma, and CKD stage 3.    PT Comments    Pt able to ambulate 30 feet x2 with bariatric RW (CGA with chair follow for sitting rest break in between ambulation trials); distance limited d/t SOB.  Nursing cleared PT to trial pt on room air.  Pt's O2 sats 91% at rest on room air.  O2 decreased to 85% after 1st ambulation trial on room air ( O2 increased back up to 91% within a few minutes of sitting rest break and vc's for pursed lip breathing).  O2 decreased to 89% on 1 L O2 via nasal cannula after 2nd ambulation trial.  Pt 96% on 1 L O2 end of session resting in chair.  Nursing notified of above O2 vitals.  Pt with intermittent difficulty standing from recliner chair (pt reports using lift chair at home to help stand and otherwise tries to sit on higher surfaces to make standing easier).  Will continue to focus on strengthening, balance, decreasing assist levels with functional mobility, and increasing activity tolerance as able.    Follow Up Recommendations  Home health PT     Equipment Recommendations  Rolling walker with 5" wheels;Wheelchair (measurements PT) (pt has RW and w/c at home already)    Recommendations for Other Services OT consult     Precautions / Restrictions Precautions Precautions: Fall Restrictions Weight Bearing Restrictions: No    Mobility  Bed Mobility               General bed mobility comments: Deferred d/t pt sitting up in chair beginning and end of session.  Transfers Overall transfer level: Needs assistance Equipment used: Rolling walker (2 wheeled) Transfers: Sit to/from Stand Sit to Stand: Mod  assist;Min guard         General transfer comment: mod assist to stand first trial; CGA 2nd trial; pt using momentum and extra time/effort to perform; pt reports using lift chair at home  Ambulation/Gait Ambulation/Gait assistance: Min guard;+2 safety/equipment (chair follow) Ambulation Distance (Feet):  (30 feet x2) Assistive device:  (Bariatric RW)   Gait velocity: decreased   General Gait Details: decreased B step length; intermittent vc's to stay closer to RW; distance limited d/t SOB   Stairs            Wheelchair Mobility    Modified Rankin (Stroke Patients Only)       Balance Overall balance assessment: Needs assistance Sitting-balance support: No upper extremity supported;Feet supported Sitting balance-Leahy Scale: Good Sitting balance - Comments: sitting reaching within BOS   Standing balance support: Bilateral upper extremity supported (on RW) Standing balance-Leahy Scale: Poor Standing balance comment: requires UE support on RW for static balance in standing                            Cognition Arousal/Alertness: Awake/alert Behavior During Therapy: WFL for tasks assessed/performed Overall Cognitive Status: Within Functional Limits for tasks assessed  Exercises      General Comments General comments (skin integrity, edema, etc.): B LE lymphedema noted.  Nursing cleared pt for participation in physical therapy.  Pt agreeable to PT session.      Pertinent Vitals/Pain Pain Assessment: Faces Faces Pain Scale: No hurt Pain Intervention(s): Limited activity within patient's tolerance;Monitored during session;Repositioned  HR WFL during session.    Home Living                      Prior Function            PT Goals (current goals can now be found in the care plan section) Acute Rehab PT Goals Patient Stated Goal: to go home PT Goal Formulation: With patient Time For  Goal Achievement: 07/23/17 Potential to Achieve Goals: Good Progress towards PT goals: Progressing toward goals    Frequency    Min 2X/week      PT Plan Current plan remains appropriate    Co-evaluation              AM-PAC PT "6 Clicks" Daily Activity  Outcome Measure  Difficulty turning over in bed (including adjusting bedclothes, sheets and blankets)?: A Lot Difficulty moving from lying on back to sitting on the side of the bed? : A Lot Difficulty sitting down on and standing up from a chair with arms (e.g., wheelchair, bedside commode, etc,.)?: Unable Help needed moving to and from a bed to chair (including a wheelchair)?: A Little Help needed walking in hospital room?: A Little Help needed climbing 3-5 steps with a railing? : A Lot 6 Click Score: 13    End of Session Equipment Utilized During Treatment: Gait belt;Oxygen Activity Tolerance: Other (comment) (Limited d/t SOB with activity) Patient left: in chair;with call bell/phone within reach;with chair alarm set Nurse Communication: Mobility status;Precautions (Pt's O2 status) PT Visit Diagnosis: Other abnormalities of gait and mobility (R26.89);Muscle weakness (generalized) (M62.81)     Time: 4360-6770 PT Time Calculation (min) (ACUTE ONLY): 25 min  Charges:  $Gait Training: 8-22 mins $Therapeutic Exercise: 8-22 mins                    G CodesLeitha Bleak, PT 07/10/17, 1:39 PM [phone]

## 2017-07-10 NOTE — Care Management (Addendum)
Patient has qualified for home oxygen if discharges within the next 48 hours.  He is agreeable to home health services but requests not to have [facility]- they did not do that much for me last time.  He does not have a preference other than that.  he says if he needs oxygen though that [facility] is okay."  Referral to [facility] for SN PT and social work at least.  Patient uses [employer] for transportation. Has a walker and wheelchair.  He was followed by [facility] and has a pump that is broken.  He can not recall the name of the company that provided the pump.  It has been out of use for two months. Reaching out to son who lives with patient to determine if he knows the name of the agency that provided the pump.  Referral to [facility] for SN PT- for lymphedema and SW.  Patient confirms his address and contact information.  He says he is current with his pcp- Dr Brynda Greathouse and denies that he is having any issues obtaining his medications. He has scales and use to weigh daily, "but I stopped. don't know why- I just did."

## 2017-07-11 LAB — BASIC METABOLIC PANEL
ANION GAP: 7 (ref 5–15)
BUN: 41 mg/dL — ABNORMAL HIGH (ref 6–20)
CALCIUM: 8.5 mg/dL — AB (ref 8.9–10.3)
CHLORIDE: 103 mmol/L (ref 101–111)
CO2: 35 mmol/L — AB (ref 22–32)
Creatinine, Ser: 2.25 mg/dL — ABNORMAL HIGH (ref 0.61–1.24)
GFR calc non Af Amer: 26 mL/min — ABNORMAL LOW (ref >60)
GFR, EST AFRICAN AMERICAN: 30 mL/min — AB (ref >60)
Glucose, Bld: 143 mg/dL — ABNORMAL HIGH (ref 65–99)
Potassium: 3.4 mmol/L — ABNORMAL LOW (ref 3.5–5.1)
SODIUM: 145 mmol/L (ref 135–145)

## 2017-07-11 MED ORDER — POTASSIUM CHLORIDE CRYS ER 20 MEQ PO TBCR: 20.0000 meq | EXTENDED_RELEASE_TABLET | Freq: Every day | ORAL | 0 refills | Status: AC

## 2017-07-11 MED ORDER — FUROSEMIDE 20 MG PO TABS: 20.0000 mg | ORAL_TABLET | Freq: Every day | ORAL | 0 refills | Status: DC

## 2017-07-11 MED ORDER — POTASSIUM CHLORIDE CRYS ER 20 MEQ PO TBCR: 20.0000 meq | EXTENDED_RELEASE_TABLET | Freq: Every day | ORAL | Status: DC

## 2017-07-11 NOTE — Progress Notes (Signed)
SATURATION QUALIFICATIONS  Patient Saturations on Room Air at Rest = 84 %  Patient Saturations on 2 Liters of oxygen at rest 95 %  Please briefly explain why patient needs home oxygen: desaturation in O2

## 2017-07-11 NOTE — Discharge Summary (Signed)
[place] [facility] [place] NAME: James Collins    MR#:  [id number]  DATE OF BIRTH:  1934-12-06  DATE OF ADMISSION:  07/09/2017 ADMITTING PHYSICIAN: Vaughan Basta, MD  DATE OF DISCHARGE: 07/11/2017   PRIMARY CARE PHYSICIAN: Marden Noble, MD    ADMISSION DIAGNOSIS:  CHF (congestive heart failure) (HCC) [I50.9] Acute congestive heart failure, unspecified heart failure type ([place]) [I50.9]  DISCHARGE DIAGNOSIS:  Principal Problem:   Acute CHF (congestive heart failure) (HCC) Active Problems:   Pressure injury of skin   SECONDARY DIAGNOSIS:   Past Medical History:  Diagnosis Date  . Chronic kidney disease   . Hypertension   . Lymphedema     HOSPITAL COURSE:   * Ac diastolic CHF IV lasix, I/o Monitor, Fluid restriction. Echo cardiogram Follow serial troponin- remained stable.   Feels better after diuresis.    * Ch respi failure   Need supplemental oxygen.   His sats were low- after receiving all appropriate treatment.   Receiving oxygen helps to maintain it > 90%.  * Hypertension Cont amlodipine and benazepril On IV lasix also  * Chronic lymphedema Cont SCD Follows at lymphedema clinic, cont. Does not look infected at this time.  * CKD stage 3 Monitor with lasix.   Follow with PMD in 1-2 week.   DISCHARGE CONDITIONS:   Stable.  CONSULTS OBTAINED:    DRUG ALLERGIES:  No Known Allergies  DISCHARGE MEDICATIONS:   Current Discharge Medication List    START taking these medications   Details  furosemide (LASIX) 20 MG tablet Take 1 tablet (20 mg total) by mouth daily. Qty: 30 tablet, Refills: 0    potassium chloride SA (K-DUR,KLOR-CON) 20 MEQ tablet Take 1 tablet (20 mEq total) by mouth daily. Qty: 30 tablet, Refills: 0      CONTINUE these medications which have NOT CHANGED   Details  amLODipine (NORVASC) 5 MG tablet Take 5 mg by mouth daily.     benazepril  (LOTENSIN) 20 MG tablet Take 20 mg by mouth daily.     calcium carbonate (TUMS - DOSED IN MG ELEMENTAL CALCIUM) 500 MG chewable tablet Chew 1 tablet by mouth 3 (three) times daily as needed for indigestion or heartburn.    finasteride (PROSCAR) 5 MG tablet Take 5 mg by mouth daily.     loratadine (CLARITIN) 10 MG tablet Take 1 tablet by mouth daily as needed for allergies.     vitamin B-12 (CYANOCOBALAMIN) 1000 MCG tablet Take 1,000 mcg by mouth daily.         DISCHARGE INSTRUCTIONS:    Follow with PMD in 1-2 weeks.  If you experience worsening of your admission symptoms, develop shortness of breath, life threatening emergency, suicidal or homicidal thoughts you must seek medical attention immediately by calling 911 or calling your MD immediately  if symptoms less severe.  You Must read complete instructions/literature along with all the possible adverse reactions/side effects for all the Medicines you take and that have been prescribed to you. Take any new Medicines after you have completely understood and accept all the possible adverse reactions/side effects.   Please note  You were cared for by a hospitalist during your hospital stay. If you have any questions about your discharge medications or the care you received while you were in the hospital after you are discharged, you can call the unit and asked to speak with the hospitalist on call if the hospitalist that took care of you  is not available. Once you are discharged, your primary care physician will handle any further medical issues. Please note that NO REFILLS for any discharge medications will be authorized once you are discharged, as it is imperative that you return to your primary care physician (or establish a relationship with a primary care physician if you do not have one) for your aftercare needs so that they can reassess your need for medications and monitor your lab values.    Today   CHIEF COMPLAINT:   Chief  Complaint  Patient presents with  . Shortness of Breath    HISTORY OF PRESENT ILLNESS:  James Collins  is a 81 y.o. male with a known history of CKD stage 3, Htn, Lymphedema- have worsening Lymphedema for last few weeks, his foot pump is broken. Getting generalized weakness and Orthopnea. Had some chills and not feeling well, so came to ER> Noted to have Pulm edema on Xray chest. A baseline- gradual decline in is working ability- just walks from room to bathroom with a walker.   VITAL SIGNS:  Blood pressure (!) 158/60, pulse 64, temperature 98.6 F (37 C), temperature source Oral, resp. rate 18, height 5\' 9"  (1.753 m), weight 135.2 kg (298 lb 1 oz), SpO2 96 %.  I/O:   Intake/Output Summary (Last 24 hours) at 07/11/17 1414 Last data filed at 07/11/17 1008  Gross per 24 hour  Intake              960 ml  Output             1025 ml  Net              -65 ml    PHYSICAL EXAMINATION:   GENERAL: 82 y.o.-year-old patient lying in the bed with no acute distress.  EYES: Pupils equal, round, reactive to light and accommodation. No scleral icterus. Extraocular muscles intact.  HEENT: Head atraumatic, normocephalic. Oropharynx and nasopharynx clear. Hard of hearing. NECK: Supple, no jugular venous distention. No thyroid enlargement, no tenderness.  LUNGS: Normal breath sounds bilaterally, no wheezing, havecrepitation. No use of accessory muscles of respiration.  CARDIOVASCULAR: S1, S2 normal. No murmurs, rubs, or gallops.  ABDOMEN: Soft, nontender, nondistended. Bowel sounds present. No organomegaly or mass.  EXTREMITIES: severe edema with chronic thickening and scaling on skin on both legs, with localized redness on right leg side. NEUROLOGIC: Cranial nerves II through XII are intact. Muscle strength 4-5/5 in all extremities. Sensation intact. Gait not checked.  PSYCHIATRIC: The patient is alert and oriented x 3.  SKIN: No obvious rash, lesion, or ulcer.  DATA REVIEW:   CBC  Recent  Labs Lab 07/10/17 0420  WBC 7.5  HGB 11.7*  HCT 34.6*  PLT 160    Chemistries   Recent Labs Lab 07/09/17 0728  07/11/17 0418  NA 143  < > 145  K 3.7  < > 3.4*  CL 103  < > 103  CO2 31  < > 35*  GLUCOSE 141*  < > 143*  BUN 22*  < > 41*  CREATININE 1.71*  < > 2.25*  CALCIUM 9.7  < > 8.5*  AST 18  --   --   ALT 13*  --   --   ALKPHOS 82  --   --   BILITOT 0.7  --   --   < > = values in this interval not displayed.  Cardiac Enzymes  Recent Labs Lab 07/09/17 1741  TROPONINI 0.03*    Microbiology  Results  Results for orders placed or performed during the hospital encounter of 02/16/16  Urine culture     Status: Abnormal   Collection Time: 02/16/16 12:35 PM  Result Value Ref Range Status   Specimen Description URINE, RANDOM  Final   Special Requests NONE  Final   Culture >=100,000 COLONIES/mL AEROCOCCUS URINAE (A)  Final   Report Status 02/18/2016 FINAL  Final    RADIOLOGY:  Dg Chest 2 View  Result Date: 07/10/2017 CLINICAL DATA:  CHF, chronic renal insufficiency. EXAM: CHEST  2 VIEW COMPARISON:  Chest x-ray of July 09, 2017 FINDINGS: The lungs are reasonably well inflated allowing for the lordotic technique. There is no focal infiltrate. The cardiac silhouette is mildly enlarged. The central pulmonary vascularity is prominent but has improved. The trachea is midline. The bony thorax exhibits no acute abnormality. IMPRESSION: Improved appearance of the pulmonary interstitium likely reflects decreasing edema. Persistent mild cardiac enlargement with central pulmonary vascular prominence. No pneumonia. Electronically Signed   By: Mykael  Martinique M.D.   On: 07/10/2017 07:35    EKG:   Orders placed or performed during the hospital encounter of 07/09/17  . ED EKG  . ED EKG  . EKG 12-Lead  . EKG 12-Lead  . EKG 12-Lead  . EKG 12-Lead      Management plans discussed with the patient, family and they are in agreement.  CODE STATUS:     Code Status Orders         Start     Ordered   07/09/17 1113  Do not attempt resuscitation (DNR)  Continuous    Question Answer Comment  In the event of cardiac or respiratory ARREST Do not call a "code blue"   In the event of cardiac or respiratory ARREST Do not perform Intubation, CPR, defibrillation or ACLS   In the event of cardiac or respiratory ARREST Use medication by any route, position, wound care, and other measures to relive pain and suffering. May use oxygen, suction and manual treatment of airway obstruction as needed for comfort.      07/09/17 1112    Code Status History    Date Active Date Inactive Code Status Order ID Comments User Context   02/16/2016  5:52 PM 02/20/2016  3:59 PM DNR [id number]  Fritzi Mandes, MD Inpatient    [facility] Directive Documentation     Most Recent Value  Type of Advance Directive  Living will, Healthcare Power of Attorney  Pre-existing out of facility DNR order (yellow form or pink MOST form)  -  "MOST" Form in Place?  -      TOTAL TIME TAKING CARE OF THIS PATIENT: 34 minutes.    Vaughan Basta M.D on 07/11/2017 at 2:14 PM  Between 7am to 6pm - Pager - [phone]  After 6pm go to [email or web address] - password [email or web address] [facility]  Office  [phone]  CC: Primary care physician; Marden Noble, MD   Note: This dictation was prepared with Dragon dictation along with smaller phrase technology. Any transcriptional errors that result from this process are unintentional.

## 2017-07-11 NOTE — Care Management Note (Signed)
Case Management Note  Patient Details  Name: YEHUDA PRINTUP MRN: [id number] Date of Birth: 12/13/1934  Subjective/Objective:  Patient qualifies for home O2. Ordered from [facility]. [facility] notified of discharge.                 Action/Plan: [facility] for SN, PT and HHA. Patient sleeping, Notified caregiver/family at bedside and she is in agreement with POC.   Expected Discharge Date:  07/11/17               Expected Discharge Plan:  [place]  In-House Referral:     Discharge planning Services  CM Consult  Post Acute Care Choice:  Home Health Choice offered to:  Patient  DME Arranged:    DME Agency:  [place] Arranged:  SN, PT and HHA [place] Agency:  [facility]  Status of Service:  Completed, signed off  If discussed at [place] of Stay Meetings, dates discussed:    Additional Comments:  Jolly Mango, RN 07/11/2017, 2:08 PM

## 2017-07-11 NOTE — Plan of Care (Signed)
Problem: Food- and Nutrition-Related Knowledge Deficit (NB-1.1) Goal: Nutrition education Formal process to instruct or train a patient/client in a skill or to impart knowledge to help patients/clients voluntarily manage or modify food choices and eating behavior to maintain or improve health.  Outcome: Adequate for Discharge Nutrition Education Note  RD consulted for nutrition education regarding new onset CHF.  Mr. Eastridge reports good appetite PTA. Will eat pancakes, bacon, eggs for breakfast, tomato sandwiches for lunch, and chicken and potatoes for dinner or something similar. He denies weight loss or decreased PO intake. Denies salt use, but patient does not cook his own meals. Lives with stepson and his girlfriend, they cook most meals. Encouraged patient to ask them to cook with low or no sodium. He also eats out 1-3x per week. Encouraged patient to avoid eating out if possible as sodium is more frequent there. Encouraged him to ask restaurants to make food without salt when he is out. Also encouraged him to make healthier choices during those trips as well.  RD provided "Low Sodium Nutrition Therapy" handout from the Academy of [employer]. Reviewed patient's dietary recall. Provided examples on ways to decrease sodium intake in diet. Discouraged intake of processed foods and use of salt shaker. Encouraged fresh fruits and vegetables as well as whole grain sources of carbohydrates to maximize fiber intake.   RD discussed why it is important for patient to adhere to diet recommendations, and emphasized the role of fluids, foods to avoid, and importance of weighing self daily. Teach back method used.  Expect fair compliance.  Body mass index is 44.02 kg/m. Pt meets criteria for obese class III based on current BMI.  Current diet order is heart healthy/carb mod, patient is consuming approximately 100% of meals at this time. Labs and medications reviewed. No further nutrition  interventions warranted at this time. RD contact information provided. If additional nutrition issues arise, please re-consult RD.   Satira Anis. Tama Grosz, MS, RD LDN Inpatient Clinical Dietitian Pager [phone]

## 2017-07-11 NOTE — Progress Notes (Signed)
DISCHARGE NOTE:  Pt given discharge instructions and prescriptions. Pt verbalized understanding. Home O2 tank delivered to bedside and applied on pt. Pt wheeled to car by staff.

## 2017-07-11 NOTE — Care Management Important Message (Signed)
Important Message  Patient Details  Name: James Collins MRN: [id number] Date of Birth: 1935-01-27   Medicare Important Message Given:  N/A - LOS <3 / Initial given by admissions    Jolly Mango, RN 07/11/2017, 2:20 PM

## 2017-07-11 NOTE — Care Management (Signed)
Patient still requires home O2 even after IV lasix.

## 2017-07-15 ENCOUNTER — Ambulatory Visit: Payer: Medicare Other | Admitting: Family

## 2017-07-15 ENCOUNTER — Ambulatory Visit: Payer: Medicare Other | Admitting: Occupational Therapy

## 2017-07-15 ENCOUNTER — Telehealth: Payer: Self-pay | Admitting: Family

## 2017-07-15 NOTE — Telephone Encounter (Signed)
Patient did not show for his Heart Failure Clinic appointment on 07/15/17. Will attempt to reschedule.

## 2017-07-16 ENCOUNTER — Ambulatory Visit: Payer: Medicare Other | Admitting: Family

## 2017-07-16 ENCOUNTER — Ambulatory Visit: Payer: Medicare Other | Admitting: Occupational Therapy

## 2017-07-22 ENCOUNTER — Ambulatory Visit: Payer: Medicare Other | Admitting: Occupational Therapy

## 2017-07-24 ENCOUNTER — Ambulatory Visit: Payer: Medicare Other | Admitting: Occupational Therapy

## 2017-07-29 ENCOUNTER — Encounter: Payer: Self-pay | Admitting: Family

## 2017-07-29 ENCOUNTER — Ambulatory Visit: Payer: Medicare Other | Admitting: Occupational Therapy

## 2017-07-29 ENCOUNTER — Ambulatory Visit: Payer: Medicare Other | Attending: Family | Admitting: Family

## 2017-07-29 DIAGNOSIS — Z7982 Long term (current) use of aspirin: Secondary | ICD-10-CM | POA: Insufficient documentation

## 2017-07-29 DIAGNOSIS — I130 Hypertensive heart and chronic kidney disease with heart failure and stage 1 through stage 4 chronic kidney disease, or unspecified chronic kidney disease: Secondary | ICD-10-CM | POA: Diagnosis not present

## 2017-07-29 DIAGNOSIS — Z9842 Cataract extraction status, left eye: Secondary | ICD-10-CM | POA: Insufficient documentation

## 2017-07-29 DIAGNOSIS — Z8249 Family history of ischemic heart disease and other diseases of the circulatory system: Secondary | ICD-10-CM | POA: Diagnosis not present

## 2017-07-29 DIAGNOSIS — N189 Chronic kidney disease, unspecified: Secondary | ICD-10-CM | POA: Insufficient documentation

## 2017-07-29 DIAGNOSIS — R0602 Shortness of breath: Secondary | ICD-10-CM | POA: Diagnosis not present

## 2017-07-29 DIAGNOSIS — I10 Essential (primary) hypertension: Secondary | ICD-10-CM | POA: Insufficient documentation

## 2017-07-29 DIAGNOSIS — Z96652 Presence of left artificial knee joint: Secondary | ICD-10-CM | POA: Insufficient documentation

## 2017-07-29 DIAGNOSIS — I890 Lymphedema, not elsewhere classified: Secondary | ICD-10-CM | POA: Insufficient documentation

## 2017-07-29 DIAGNOSIS — I5032 Chronic diastolic (congestive) heart failure: Secondary | ICD-10-CM | POA: Insufficient documentation

## 2017-07-29 DIAGNOSIS — R42 Dizziness and giddiness: Secondary | ICD-10-CM | POA: Insufficient documentation

## 2017-07-29 DIAGNOSIS — Z79899 Other long term (current) drug therapy: Secondary | ICD-10-CM | POA: Insufficient documentation

## 2017-07-29 NOTE — Patient Instructions (Addendum)
Continue weighing daily and call for an overnight weight gain of > 2 pounds or a weekly weight gain of >5 pounds.  Call to establish care with primary.  [facility]- [phone] [place] [facility]- Manuel Garcia [phone]

## 2017-07-29 NOTE — Progress Notes (Signed)
Patient ID: James Collins, male    DOB: [birth year], 82 y.o.   MRN: [id number]  HPI  James Collins is an 82 y/o male with a history of lymphedema, HTN, CKD and chronic heart failure.   Echo from 07/09/17 reviewed and shows an EF of 65-70%.   Admitted 07/09/17 due to acute HF. Initially needed IV diuretics and then transitioned to oral diuretics. Followed at lymphedema clinic for chronic lymphedema. Discharged home after 2 days.  He presents today for his initial appointment with a chief complaint of moderate fatigue with minimal exertion. He describes this as chronic in nature having been present for several years with varying levels of severity. He has associated shortness of breath, edema and light-headedness along with this. He denies any chest pain, palpitations, wheezing, weight gain or difficulty sleeping. Does endorse sleeping in a recliner but says that he's done that for years and sleeps in it because he's more comfortable  Past Medical History:  Diagnosis Date  . CHF (congestive heart failure) ([place])   . Chronic kidney disease   . Hypertension   . Lymphedema    Past Surgical History:  Procedure Laterality Date  . CATARACT EXTRACTION, BILATERAL Bilateral   . REPLACEMENT TOTAL KNEE Left    Family History  Problem Relation Age of Onset  . CAD Father    Social History  Substance Use Topics  . Smoking status: Never Smoker  . Smokeless tobacco: Never Used  . Alcohol use No   No Known Allergies Prior to Admission medications   Medication Sig Start Date End Date Taking? Authorizing Provider  amLODipine (NORVASC) 5 MG tablet Take 5 mg by mouth daily.    Yes [provider]  aspirin EC 81 MG tablet Take 81 mg by mouth daily.   Yes [provider]  benazepril (LOTENSIN) 20 MG tablet Take 20 mg by mouth daily.    Yes [provider]  calcium carbonate (TUMS - DOSED IN MG ELEMENTAL CALCIUM) 500 MG chewable tablet Chew 1 tablet by mouth 3 (three) times  daily as needed for indigestion or heartburn.   Yes [provider]  Cholecalciferol (D3-1000) 1000 units capsule Take 1,000 Units by mouth daily.   Yes [provider]  finasteride (PROSCAR) 5 MG tablet Take 5 mg by mouth daily.    Yes [provider]  furosemide (LASIX) 20 MG tablet Take 1 tablet (20 mg total) by mouth daily. 07/11/17 07/11/18 Yes Vaughan Basta, MD  loratadine (CLARITIN) 10 MG tablet Take 1 tablet by mouth daily as needed for allergies.    Yes [provider]  potassium chloride SA (K-DUR,KLOR-CON) 20 MEQ tablet Take 1 tablet (20 mEq total) by mouth daily. 07/11/17  Yes Vaughan Basta, MD  vitamin B-12 (CYANOCOBALAMIN) 1000 MCG tablet Take 1,000 mcg by mouth daily.   Yes [provider]    Review of Systems  Constitutional: Positive for fatigue. Negative for appetite change.  HENT: Positive for hearing loss. Negative for congestion, postnasal drip and sore throat.   Eyes: Negative.   Respiratory: Positive for shortness of breath. Negative for chest tightness and wheezing.   Cardiovascular: Positive for leg swelling. Negative for chest pain and palpitations.  Gastrointestinal: Negative for abdominal distention and abdominal pain.  Endocrine: Negative.   Genitourinary: Negative.   Musculoskeletal: Positive for arthralgias (left shoulder). Negative for back pain.  Skin: Negative.   Allergic/Immunologic: Negative.   Neurological: Positive for light-headedness (with sudden position changes). Negative for dizziness.  Hematological:  Negative for adenopathy. Does not bruise/bleed easily.  Psychiatric/Behavioral: Positive for sleep disturbance (sleeps in recliner due to comfort). Negative for dysphoric mood. The patient is not nervous/anxious.    Vitals:   07/29/17 1012  BP: (!) 164/72  Pulse: 84  Resp: 20  SpO2: 98%  Weight: 283 lb (128.4 kg)  Height: 5\' 8"  (1.727 m)   Wt Readings from Last 3 Encounters:   07/29/17 283 lb (128.4 kg)  07/11/17 298 lb 1 oz (135.2 kg)  02/19/16 266 lb 15.6 oz (121.1 kg)   Lab Results  Component Value Date   CREATININE 2.25 (H) 07/11/2017   CREATININE 2.05 (H) 07/10/2017   CREATININE 1.71 (H) 07/09/2017    Physical Exam  Constitutional: He is oriented to person, place, and time. He appears well-developed and well-nourished.  HENT:  Head: Normocephalic and atraumatic.  Right Ear: Decreased hearing is noted.  Left Ear: Decreased hearing is noted.  Neck: Normal range of motion. Neck supple. No JVD present.  Cardiovascular: Normal rate and regular rhythm.   Pulmonary/Chest: Effort normal. He has no wheezes. He has no rales.  Abdominal: Soft. He exhibits no distension. There is no tenderness.  Musculoskeletal: He exhibits edema (lymphedema present bilateral lower legs). He exhibits no tenderness.  Neurological: He is alert and oriented to person, place, and time.  Skin: Skin is warm and dry.  Psychiatric: He has a normal mood and affect. His behavior is normal. Thought content normal.  Nursing note and vitals reviewed.   Assessment & Plan:  1: Chronic heart failure with preserved ejection fraction- - NYHA class III - euvolemic today - weighing daily at home and says that weight has steadily declined. Knows to call for an overnight weight gain of >2 pounds or a weekly weight gain of >5 pounds - not adding salt and the family member (step-son's girlfriend) does the shopping and cooking. She reports reading food labels and discussed the importance of keeping his daily sodium intake to 2000mg  daily. Written daily information was provided to them about this.  - drinking ~ 1.5 hospital cups full of fluid daily (~45 ounces) - does have home health nurse/physical therapist coming to the home; encouraged him to be as active as possible - wearing oxygen at 2L around the clock right now - he thinks he received the flu vaccine while hospitalized but is unsure -  PharmD went in and reviewed medications with the patient and family member  2: HTN- - current PCP has retired so a list of providers was given to the patient for them to call and see who is accepting new patients - BMP from 07/11/17 reviewed and shows sodium 145, potassium 3.4 and GFR 26  3: Lymphedema- - does elevate his legs at home in the recliner - has been seen at the wound center in the past - does have compression boots at home but they are currently broke and are supposed to be replaced by the company. Family member present says that she will call the company and follow-up about this. - unable to wear TED hose due to the size of his lower legs  Medication bottles were reviewed.  Return here in 1 month or sooner for any questions/problems before then.

## 2017-07-30 ENCOUNTER — Ambulatory Visit: Payer: Medicare Other | Admitting: Occupational Therapy

## 2017-08-01 ENCOUNTER — Encounter: Payer: Medicare Other | Admitting: Occupational Therapy

## 2017-08-05 ENCOUNTER — Ambulatory Visit: Payer: Medicare Other | Admitting: Occupational Therapy

## 2017-08-06 ENCOUNTER — Encounter: Payer: Medicare Other | Admitting: Occupational Therapy

## 2017-08-08 ENCOUNTER — Encounter: Payer: Medicare Other | Admitting: Occupational Therapy

## 2017-08-12 ENCOUNTER — Encounter: Payer: Medicare Other | Admitting: Occupational Therapy

## 2017-08-13 ENCOUNTER — Other Ambulatory Visit: Payer: Self-pay

## 2017-08-13 ENCOUNTER — Encounter: Payer: Medicare Other | Admitting: Occupational Therapy

## 2017-08-13 ENCOUNTER — Emergency Department
Admission: EM | Admit: 2017-08-13 | Discharge: 2017-08-13 | Disposition: A | Discharge status: Home / Self Care | Payer: Medicare Other | Attending: Emergency Medicine | Admitting: Emergency Medicine

## 2017-08-13 DIAGNOSIS — Z79899 Other long term (current) drug therapy: Secondary | ICD-10-CM | POA: Insufficient documentation

## 2017-08-13 DIAGNOSIS — R531 Weakness: Secondary | ICD-10-CM | POA: Insufficient documentation

## 2017-08-13 DIAGNOSIS — I130 Hypertensive heart and chronic kidney disease with heart failure and stage 1 through stage 4 chronic kidney disease, or unspecified chronic kidney disease: Secondary | ICD-10-CM | POA: Diagnosis not present

## 2017-08-13 DIAGNOSIS — N189 Chronic kidney disease, unspecified: Secondary | ICD-10-CM | POA: Insufficient documentation

## 2017-08-13 DIAGNOSIS — I5032 Chronic diastolic (congestive) heart failure: Secondary | ICD-10-CM | POA: Insufficient documentation

## 2017-08-13 DIAGNOSIS — Z7982 Long term (current) use of aspirin: Secondary | ICD-10-CM | POA: Insufficient documentation

## 2017-08-13 DIAGNOSIS — E1122 Type 2 diabetes mellitus with diabetic chronic kidney disease: Secondary | ICD-10-CM | POA: Diagnosis not present

## 2017-08-13 HISTORY — DX: Type 2 diabetes mellitus without complications: E11.9

## 2017-08-13 LAB — URINALYSIS, COMPLETE (UACMP) WITH MICROSCOPIC
BILIRUBIN URINE: NEGATIVE
Bacteria, UA: NONE SEEN
Glucose, UA: 50 mg/dL — AB
HGB URINE DIPSTICK: NEGATIVE
Ketones, ur: NEGATIVE mg/dL
Leukocytes, UA: NEGATIVE
NITRITE: NEGATIVE
Specific Gravity, Urine: 1.015 (ref 1.005–1.030)
pH: 5 (ref 5.0–8.0)

## 2017-08-13 LAB — BASIC METABOLIC PANEL
ANION GAP: 9 (ref 5–15)
BUN: 35 mg/dL — ABNORMAL HIGH (ref 6–20)
CALCIUM: 9 mg/dL (ref 8.9–10.3)
CO2: 27 mmol/L (ref 22–32)
Chloride: 106 mmol/L (ref 101–111)
Creatinine, Ser: 1.88 mg/dL — ABNORMAL HIGH (ref 0.61–1.24)
GFR calc Af Amer: 37 mL/min — ABNORMAL LOW (ref >60)
GFR, EST NON AFRICAN AMERICAN: 32 mL/min — AB (ref >60)
Glucose, Bld: 127 mg/dL — ABNORMAL HIGH (ref 65–99)
POTASSIUM: 4 mmol/L (ref 3.5–5.1)
SODIUM: 142 mmol/L (ref 135–145)

## 2017-08-13 LAB — CBC
HEMATOCRIT: 37.9 % — AB (ref 40.0–52.0)
HEMOGLOBIN: 12.4 g/dL — AB (ref 13.0–18.0)
MCH: 27.9 pg (ref 26.0–34.0)
MCHC: 32.7 g/dL (ref 32.0–36.0)
MCV: 85.5 fL (ref 80.0–100.0)
Platelets: 149 10*3/uL — ABNORMAL LOW (ref 150–440)
RBC: 4.44 MIL/uL (ref 4.40–5.90)
RDW: 16.1 % — ABNORMAL HIGH (ref 11.5–14.5)
WBC: 8.1 10*3/uL (ref 3.8–10.6)

## 2017-08-13 LAB — TROPONIN I: TROPONIN I: 0.03 ng/mL — AB (ref <0.03)

## 2017-08-13 NOTE — ED Notes (Signed)
Pt discharged home after verbalizing understanding of discharge instructions; nad noted. 

## 2017-08-13 NOTE — ED Notes (Signed)
Pt from home via ems after falling this morning. States he fell when reaching for a chair and has felt weak since then. Denies pain, then states he has some pain in his left knee, which was the first to hit the floor. Pt states he had a home health nurse come to his house who said he should cone to ED to be checked out. Pt alert & oriented.

## 2017-08-13 NOTE — ED Provider Notes (Signed)
[facility] Emergency Department Provider Note  ____________________________________________   I have reviewed the triage vital signs and the nursing notes.   HISTORY  Chief Complaint Weakness   History limited by: Not Limited   HPI James Collins is a 82 y.o. male who presents to the emergency department today at the request of home nurse because of weakness   LOCATION:legs DURATION:started roughly 1 year ago TIMING: intermittent, last episode one month ago SEVERITY: caused patient to fall QUALITY: felt like knees gave out CONTEXT: patient states that he has been having episodes of weakness for roughly 1 year. Today he was getting off of the toilet when he felt weak and felt his knees go out from under him. He denies any traumatic injury. MODIFYING FACTORS: none ASSOCIATED SYMPTOMS: denies any chest pain. Denies any sob.   Per medical record review patient has a history of CHF, was admitted last month for a chf exacerbation.  Past Medical History:  Diagnosis Date  . CHF (congestive heart failure) ([place])   . Chronic kidney disease   . Diabetes mellitus without complication ([place])   . Hypertension   . Lymphedema     Patient Active Problem List   Diagnosis Date Noted  . Chronic diastolic heart failure ([place]) 07/29/2017  . HTN (hypertension) 07/29/2017  . Lymphedema 07/29/2017  . Cellulitis 07/09/2017  . Pressure injury of skin 07/09/2017  . 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 CATARACT EXTRACTION, BILATERAL Bilateral   . REPLACEMENT TOTAL KNEE Left     Prior to Admission medications   Medication Sig Start Date End Date Taking? Authorizing Provider  amLODipine (NORVASC) 5 MG tablet Take 5 mg by mouth daily.    Yes [provider]  aspirin EC 81 MG tablet Take 81 mg by mouth daily.   Yes [provider]  benazepril (LOTENSIN) 20 MG tablet Take 20 mg by mouth daily.    Yes  [provider]  Cholecalciferol (D3-1000) 1000 units capsule Take 1,000 Units by mouth daily.   Yes [provider]  finasteride (PROSCAR) 5 MG tablet Take 5 mg by mouth daily.    Yes [provider]  mirtazapine (REMERON) 15 MG tablet Take 15 mg at bedtime by mouth.   Yes [provider]  ranitidine (ZANTAC) 150 MG tablet Take 150 mg daily as needed by mouth for heartburn.   Yes [provider]  vitamin B-12 (CYANOCOBALAMIN) 1000 MCG tablet Take 1,000 mcg by mouth daily.   Yes [provider]  calcium carbonate (TUMS - DOSED IN MG ELEMENTAL CALCIUM) 500 MG chewable tablet Chew 1 tablet by mouth 3 (three) times daily as needed for indigestion or heartburn.    [provider]  furosemide (LASIX) 20 MG tablet Take 1 tablet (20 mg total) by mouth daily. 07/11/17 07/11/18  Vaughan Basta, MD  loratadine (CLARITIN) 10 MG tablet Take 1 tablet by mouth daily as needed for allergies.     [provider]  potassium chloride SA (K-DUR,KLOR-CON) 20 MEQ tablet Take 1 tablet (20 mEq total) by mouth daily. 07/11/17   Vaughan Basta, MD    Allergies Patient has no known allergies.  Family History  Problem Relation Age of Onset  . CAD Father     Social History Social History   Tobacco Use  . Smoking status: Never Smoker  . Smokeless tobacco: Never Used  Substance Use Topics  . Alcohol use: No  . Drug  use: No    Review of Systems Constitutional: No fever/chills Eyes: No visual changes. ENT: No sore throat. Cardiovascular: Denies chest pain. Respiratory: Denies shortness of breath. Gastrointestinal: No abdominal pain.  No nausea, no vomiting.  No diarrhea.   Genitourinary: Negative for dysuria. Musculoskeletal: Negative for back pain. Skin: Negative for rash. Neurological: Negative for headaches, focal weakness or numbness.  ____________________________________________   PHYSICAL EXAM:  VITAL  SIGNS: ED Triage Vitals  Enc Vitals Group     BP 08/13/17 1410 (!) 165/92     Pulse Rate 08/13/17 1410 77     Resp 08/13/17 1410 17     Temp 08/13/17 1410 98.7 F (37.1 C)     Temp Source 08/13/17 1410 Oral     SpO2 08/13/17 1410 98 %     Weight 08/13/17 1405 300 lb (136.1 kg)     Height 08/13/17 1405 5\' 8"  (1.727 m)     Head Circumference --      Peak Flow --      Pain Score 08/13/17 1405 0   Constitutional: Alert and oriented. Well appearing and in no distress. Eyes: Conjunctivae are normal.  ENT   Head: Normocephalic and atraumatic.   Nose: No congestion/rhinnorhea.   Mouth/Throat: Mucous membranes are moist.   Neck: No stridor. Hematological/Lymphatic/Immunilogical: No cervical lymphadenopathy. Cardiovascular: Normal rate, regular rhythm.  No murmurs, rubs, or gallops. Respiratory: Normal respiratory effort without tachypnea nor retractions. Breath sounds are clear and equal bilaterally. No wheezes/rales/rhonchi. Gastrointestinal: Soft and non tender. No rebound. No guarding.  Genitourinary: Deferred Musculoskeletal: Normal range of motion in all extremities. Bilateral lower extremity edema.  Neurologic:  Normal speech and language. No gross focal neurologic deficits are appreciated.  Skin:  Skin is warm, dry and intact. No rash noted. Psychiatric: Mood and affect are normal. Speech and behavior are normal. Patient exhibits appropriate insight and judgment.  ____________________________________________    LABS (pertinent positives/negatives)  UA wbc 6-30 no other markers of infection CBC wbc 8.1, hgb 12.4 BMP cr 1.88, glu 127  ____________________________________________   EKG  I, Nance Pear, attending physician, personally viewed and interpreted this EKG  EKG Time: 1406 Rate: 77 Rhythm: normal sinus rhythm with PVC Axis: normal Intervals: qtc normal QRS: narrow, q waves V1 ST changes: no st elevation Impression: abnormal  ekg   ____________________________________________    RADIOLOGY  None  ____________________________________________   PROCEDURES  Procedures  ____________________________________________   INITIAL IMPRESSION / ASSESSMENT AND PLAN / ED COURSE  Pertinent labs & imaging results that were available during my care of the patient were reviewed by me and considered in my medical decision making (see chart for details).  Patient presented with weakness. Ddx would include infection, electrolyte abnormality, anemia, cardiac disease amongst other etiology. Work up does show slight anemia however it is roughly patient's baseline. 6-30 wbcs but no other obvious signs of infection on urine. Will send for culture. Patient was stood and walked here and was at baseline. At this point think patient can be discharged home. Discussed results and plan with patient and family.  ____________________________________________   FINAL CLINICAL IMPRESSION(S) / ED DIAGNOSES  Final diagnoses:  Weakness     Note: This dictation was prepared with Dragon dictation. Any transcriptional errors that result from this process are unintentional     Nance Pear, MD 08/13/17 1904

## 2017-08-13 NOTE — Discharge Instructions (Signed)
Please seek medical attention for any high fevers, chest pain, shortness of breath, change in behavior, persistent vomiting, bloody stool or any other new or concerning symptoms.  

## 2017-08-13 NOTE — ED Triage Notes (Signed)
Pt arrives via [facility] from home for a fall this morning. No complaints from fall. No pain anywhere. Pt states he feels weak. 2 L [place] chronically. EMS reports hx UTI, HTN, DM but not insulin dependent. Pt states he fell on L knee. Pt uses a walker and wheelchair at home. Pt is HOH! States he fell on linoleum floor. Denies LOC.

## 2017-08-15 ENCOUNTER — Encounter: Payer: Medicare Other | Admitting: Occupational Therapy

## 2017-08-15 LAB — URINE CULTURE

## 2017-08-19 ENCOUNTER — Encounter: Payer: Medicare Other | Admitting: Occupational Therapy

## 2017-08-20 ENCOUNTER — Encounter: Payer: Medicare Other | Admitting: Occupational Therapy

## 2017-08-26 ENCOUNTER — Encounter: Payer: Medicare Other | Admitting: Occupational Therapy

## 2017-08-27 ENCOUNTER — Encounter: Payer: Medicare Other | Admitting: Occupational Therapy

## 2017-08-29 ENCOUNTER — Ambulatory Visit: Payer: Medicare Other | Admitting: Family

## 2017-09-02 ENCOUNTER — Encounter: Payer: Medicare Other | Admitting: Occupational Therapy

## 2017-09-03 ENCOUNTER — Encounter: Payer: Medicare Other | Admitting: Occupational Therapy

## 2017-09-05 ENCOUNTER — Encounter: Payer: Medicare Other | Admitting: Occupational Therapy

## 2017-09-09 ENCOUNTER — Encounter: Payer: Medicare Other | Admitting: Occupational Therapy

## 2017-09-10 ENCOUNTER — Encounter: Payer: Medicare Other | Admitting: Occupational Therapy

## 2017-09-12 ENCOUNTER — Encounter: Payer: Medicare Other | Admitting: Occupational Therapy

## 2017-09-16 ENCOUNTER — Encounter: Payer: Medicare Other | Admitting: Occupational Therapy

## 2017-09-17 ENCOUNTER — Encounter: Payer: Medicare Other | Admitting: Occupational Therapy

## 2017-09-19 ENCOUNTER — Encounter: Payer: Medicare Other | Admitting: Occupational Therapy

## 2017-09-19 ENCOUNTER — Ambulatory Visit: Payer: Medicare Other | Admitting: Family

## 2017-09-23 ENCOUNTER — Encounter: Payer: Medicare Other | Admitting: Occupational Therapy

## 2017-09-25 ENCOUNTER — Encounter: Payer: Medicare Other | Admitting: Occupational Therapy

## 2017-09-26 ENCOUNTER — Encounter: Payer: Medicare Other | Admitting: Occupational Therapy

## 2017-09-30 ENCOUNTER — Encounter: Payer: Medicare Other | Admitting: Occupational Therapy

## 2017-10-02 ENCOUNTER — Encounter: Payer: Medicare Other | Admitting: Occupational Therapy

## 2017-10-03 ENCOUNTER — Encounter: Payer: Medicare Other | Admitting: Occupational Therapy

## 2017-10-07 ENCOUNTER — Encounter: Payer: Medicare Other | Admitting: Occupational Therapy

## 2017-10-08 ENCOUNTER — Encounter: Payer: Medicare Other | Admitting: Occupational Therapy

## 2017-10-09 NOTE — Progress Notes (Deleted)
Patient ID: James Collins, male    DOB: 05/18/1935, 83 y.o.   MRN: [id number]  HPI  Mr Walth is an 83 y/o male with a history of lymphedema, HTN, CKD and chronic heart failure.   Echo from 07/09/17 reviewed and shows an EF of 65-70%.   Was in the ED 08/13/17 due to weakness where he was treated and released. Admitted 07/09/17 due to acute HF. Initially needed IV diuretics and then transitioned to oral diuretics. Followed at lymphedema clinic for chronic lymphedema. Discharged home after 2 days.  He presents today for a follow-up appointment with a chief complaint of   Past Medical History:  Diagnosis Date  . CHF (congestive heart failure) ([place])   . Chronic kidney disease   . Diabetes mellitus without complication ([place])   . Hypertension   . Lymphedema    Past Surgical History:  Procedure Laterality Date  . CATARACT EXTRACTION, BILATERAL Bilateral   . REPLACEMENT TOTAL KNEE Left    Family History  Problem Relation Age of Onset  . CAD Father    Social History   Tobacco Use  . Smoking status: Never Smoker  . Smokeless tobacco: Never Used  Substance Use Topics  . Alcohol use: No   No Known Allergies   Review of Systems  Constitutional: Positive for fatigue. Negative for appetite change.  HENT: Positive for hearing loss. Negative for congestion, postnasal drip and sore throat.   Eyes: Negative.   Respiratory: Positive for shortness of breath. Negative for chest tightness and wheezing.   Cardiovascular: Positive for leg swelling. Negative for chest pain and palpitations.  Gastrointestinal: Negative for abdominal distention and abdominal pain.  Endocrine: Negative.   Genitourinary: Negative.   Musculoskeletal: Positive for arthralgias (left shoulder). Negative for back pain.  Skin: Negative.   Allergic/Immunologic: Negative.   Neurological: Positive for light-headedness (with sudden position changes). Negative for dizziness.  Hematological: Negative for adenopathy.  Does not bruise/bleed easily.  Psychiatric/Behavioral: Positive for sleep disturbance (sleeps in recliner due to comfort). Negative for dysphoric mood. The patient is not nervous/anxious.      Physical Exam  Constitutional: He is oriented to person, place, and time. He appears well-developed and well-nourished.  HENT:  Head: Normocephalic and atraumatic.  Right Ear: Decreased hearing is noted.  Left Ear: Decreased hearing is noted.  Neck: Normal range of motion. Neck supple. No JVD present.  Cardiovascular: Normal rate and regular rhythm.  Pulmonary/Chest: Effort normal. He has no wheezes. He has no rales.  Abdominal: Soft. He exhibits no distension. There is no tenderness.  Musculoskeletal: He exhibits edema (lymphedema present bilateral lower legs). He exhibits no tenderness.  Neurological: He is alert and oriented to person, place, and time.  Skin: Skin is warm and dry.  Psychiatric: He has a normal mood and affect. His behavior is normal. Thought content normal.  Nursing note and vitals reviewed.   Assessment & Plan:  1: Chronic heart failure with preserved ejection fraction- - NYHA class III - euvolemic today - weighing daily at home and says that weight has steadily declined. Knows to call for an overnight weight gain of >2 pounds or a weekly weight gain of >5 pounds - not adding salt and the family member (step-son's girlfriend) does the shopping and cooking. She reports reading food labels and discussed the importance of keeping his daily sodium intake to 2000mg  daily. Written daily information was provided to them about this.  - drinking ~ 1.5 hospital cups full of fluid daily (~  45 ounces) - does have home health nurse/physical therapist coming to the home; encouraged him to be as active as possible - wearing oxygen at 2L around the clock right now - he thinks he received the flu vaccine while hospitalized but is unsure - PharmD went in and reviewed medications with the  patient and family member  2: HTN- - current PCP has retired so a list of providers was given to the patient for them to call and see who is accepting new patients - BMP from 08/13/17 reviewed and shows sodium 142, potassium 4.0 and GFR 32  3: Lymphedema- - does elevate his legs at home in the recliner - has been seen at the wound center in the past - does have compression boots at home but they are currently broke and are supposed to be replaced by the company. Family member present says that she will call the company and follow-up about this. - unable to wear TED hose due to the size of his lower legs  Medication bottles were reviewed.

## 2017-10-10 ENCOUNTER — Telehealth: Payer: Self-pay | Admitting: Family

## 2017-10-10 ENCOUNTER — Encounter: Payer: Medicare Other | Admitting: Occupational Therapy

## 2017-10-10 ENCOUNTER — Ambulatory Visit: Payer: Medicare Other | Admitting: Family

## 2017-10-10 NOTE — Telephone Encounter (Signed)
Patient did not show for his [facility] appointment on 10/10/17. Will attempt to reschedule.

## 2017-10-14 ENCOUNTER — Encounter: Payer: Medicare Other | Admitting: Occupational Therapy

## 2017-10-15 ENCOUNTER — Encounter: Payer: Medicare Other | Admitting: Occupational Therapy

## 2017-10-17 ENCOUNTER — Encounter: Payer: Medicare Other | Admitting: Occupational Therapy

## 2017-10-21 ENCOUNTER — Encounter: Payer: Medicare Other | Admitting: Occupational Therapy

## 2017-10-22 ENCOUNTER — Encounter: Payer: Medicare Other | Admitting: Occupational Therapy

## 2017-10-24 ENCOUNTER — Encounter: Payer: Medicare Other | Admitting: Occupational Therapy

## 2017-10-25 ENCOUNTER — Emergency Department
Admission: EM | Admit: 2017-10-25 | Discharge: 2017-10-25 | Disposition: A | Discharge status: Home / Self Care | Payer: Medicare Other | Attending: Emergency Medicine | Admitting: Emergency Medicine

## 2017-10-25 ENCOUNTER — Emergency Department: Payer: Medicare Other

## 2017-10-25 ENCOUNTER — Other Ambulatory Visit: Payer: Self-pay

## 2017-10-25 DIAGNOSIS — Y929 Unspecified place or not applicable: Secondary | ICD-10-CM | POA: Diagnosis not present

## 2017-10-25 DIAGNOSIS — Y9301 Activity, walking, marching and hiking: Secondary | ICD-10-CM | POA: Diagnosis not present

## 2017-10-25 DIAGNOSIS — S0990XA Unspecified injury of head, initial encounter: Secondary | ICD-10-CM | POA: Diagnosis present

## 2017-10-25 DIAGNOSIS — I5032 Chronic diastolic (congestive) heart failure: Secondary | ICD-10-CM | POA: Diagnosis not present

## 2017-10-25 DIAGNOSIS — N189 Chronic kidney disease, unspecified: Secondary | ICD-10-CM | POA: Insufficient documentation

## 2017-10-25 DIAGNOSIS — Y999 Unspecified external cause status: Secondary | ICD-10-CM | POA: Diagnosis not present

## 2017-10-25 DIAGNOSIS — S0081XA Abrasion of other part of head, initial encounter: Secondary | ICD-10-CM | POA: Insufficient documentation

## 2017-10-25 DIAGNOSIS — J449 Chronic obstructive pulmonary disease, unspecified: Secondary | ICD-10-CM | POA: Diagnosis not present

## 2017-10-25 DIAGNOSIS — Z96652 Presence of left artificial knee joint: Secondary | ICD-10-CM | POA: Diagnosis not present

## 2017-10-25 DIAGNOSIS — W19XXXA Unspecified fall, initial encounter: Secondary | ICD-10-CM | POA: Diagnosis not present

## 2017-10-25 DIAGNOSIS — I130 Hypertensive heart and chronic kidney disease with heart failure and stage 1 through stage 4 chronic kidney disease, or unspecified chronic kidney disease: Secondary | ICD-10-CM | POA: Diagnosis not present

## 2017-10-25 DIAGNOSIS — Z79899 Other long term (current) drug therapy: Secondary | ICD-10-CM | POA: Diagnosis not present

## 2017-10-25 DIAGNOSIS — Z7982 Long term (current) use of aspirin: Secondary | ICD-10-CM | POA: Insufficient documentation

## 2017-10-25 HISTORY — DX: Chronic obstructive pulmonary disease, unspecified: J44.9

## 2017-10-25 LAB — CBC
HCT: 33.1 % — ABNORMAL LOW (ref 40.0–52.0)
Hemoglobin: 10.8 g/dL — ABNORMAL LOW (ref 13.0–18.0)
MCH: 28.3 pg (ref 26.0–34.0)
MCHC: 32.5 g/dL (ref 32.0–36.0)
MCV: 87 fL (ref 80.0–100.0)
PLATELETS: 148 10*3/uL — AB (ref 150–440)
RBC: 3.81 MIL/uL — ABNORMAL LOW (ref 4.40–5.90)
RDW: 15 % — AB (ref 11.5–14.5)
WBC: 11.3 10*3/uL — AB (ref 3.8–10.6)

## 2017-10-25 LAB — COMPREHENSIVE METABOLIC PANEL WITH GFR
ALT: 11 U/L — ABNORMAL LOW (ref 17–63)
AST: 20 U/L (ref 15–41)
Albumin: 3.4 g/dL — ABNORMAL LOW (ref 3.5–5.0)
Alkaline Phosphatase: 67 U/L (ref 38–126)
Anion gap: 7 (ref 5–15)
BUN: 31 mg/dL — ABNORMAL HIGH (ref 6–20)
CO2: 31 mmol/L (ref 22–32)
Calcium: 9.6 mg/dL (ref 8.9–10.3)
Chloride: 103 mmol/L (ref 101–111)
Creatinine, Ser: 1.88 mg/dL — ABNORMAL HIGH (ref 0.61–1.24)
GFR calc Af Amer: 37 mL/min — ABNORMAL LOW (ref >60)
GFR calc non Af Amer: 32 mL/min — ABNORMAL LOW (ref >60)
Glucose, Bld: 164 mg/dL — ABNORMAL HIGH (ref 65–99)
Potassium: 4.6 mmol/L (ref 3.5–5.1)
Sodium: 141 mmol/L (ref 135–145)
Total Bilirubin: 1 mg/dL (ref 0.3–1.2)
Total Protein: 6.5 g/dL (ref 6.5–8.1)

## 2017-10-25 LAB — TROPONIN I: Troponin I: <0.03 ng/mL (ref <0.03)

## 2017-10-25 LAB — BRAIN NATRIURETIC PEPTIDE: B Natriuretic Peptide: 295 pg/mL — ABNORMAL HIGH (ref 0.0–100.0)

## 2017-10-25 NOTE — ED Notes (Signed)
X-ray at bedside

## 2017-10-25 NOTE — ED Notes (Signed)
ED Provider at bedside. 

## 2017-10-25 NOTE — ED Notes (Addendum)
Pt unable to get into the car that family members drove to hospital. Family leaving to go get another car and patient portable oxygen.  dirty clothes taken with family.

## 2017-10-25 NOTE — ED Triage Notes (Addendum)
Pt to ER via [facility] from home after falling today. Pt states that "my legs gave out". Pt fell face first. Pt c/o bilateral shoulder pain because "I was lying on my arms". Small abrasion to left side of head, no bleeding this time. Denies LOC.  EDP at bedside. Pt wears oxygen at baseline, 2L [place]. Pt alert and oriented X 4 at this time.

## 2017-10-25 NOTE — ED Provider Notes (Signed)
[facility] Emergency Department Provider Note   ____________________________________________    I have reviewed the triage vital signs and the nursing notes.   HISTORY  Chief Complaint Fall     HPI James Collins is a 83 y.o. male who presents after a fall.  Patient denies injury.  Patient reports that he ambulates with a walker.  He reports his legs gave out on him which apparently occurs relatively frequently.Marland Kitchen  He denies LOC, denies extremity injury.  No pain in the hips.  No pain in the back.  No abdominal pain or chest pain.  No dizziness or neuro deficits.  He does report that he had difficulty getting up which is why he had to call for help.  Currently feels well.  He does wear 2 L of oxygen at all times.  Reports his breathing is at baseline.  Past Medical History:  Diagnosis Date  . CHF (congestive heart failure) ([place])   . Chronic kidney disease   . COPD (chronic obstructive pulmonary disease) ([place])   . Diabetes mellitus without complication ([place])   . Hypertension   . Lymphedema     Patient Active Problem List   Diagnosis Date Noted  . Chronic diastolic heart failure ([place]) 07/29/2017  . HTN (hypertension) 07/29/2017  . Lymphedema 07/29/2017  . Cellulitis 07/09/2017  . Pressure injury of skin 07/09/2017  . 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 CATARACT EXTRACTION, BILATERAL Bilateral   . REPLACEMENT TOTAL KNEE Left     Prior to Admission medications   Medication Sig Start Date End Date Taking? Authorizing Provider  amLODipine (NORVASC) 5 MG tablet Take 5 mg by mouth daily.     [provider]  aspirin EC 81 MG tablet Take 81 mg by mouth daily.    [provider]  benazepril (LOTENSIN) 20 MG tablet Take 20 mg by mouth daily.     [provider]  calcium carbonate (TUMS - DOSED IN MG ELEMENTAL CALCIUM) 500 MG chewable tablet Chew 1 tablet by mouth 3 (three)  times daily as needed for indigestion or heartburn.    [provider]  Cholecalciferol (D3-1000) 1000 units capsule Take 1,000 Units by mouth daily.    [provider]  finasteride (PROSCAR) 5 MG tablet Take 5 mg by mouth daily.     [provider]  furosemide (LASIX) 20 MG tablet Take 1 tablet (20 mg total) by mouth daily. 07/11/17 07/11/18  Vaughan Basta, MD  loratadine (CLARITIN) 10 MG tablet Take 1 tablet by mouth daily as needed for allergies.     [provider]  mirtazapine (REMERON) 15 MG tablet Take 15 mg at bedtime by mouth.    [provider]  potassium chloride SA (K-DUR,KLOR-CON) 20 MEQ tablet Take 1 tablet (20 mEq total) by mouth daily. 07/11/17   Vaughan Basta, MD  ranitidine (ZANTAC) 150 MG tablet Take 150 mg daily as needed by mouth for heartburn.    [provider]  vitamin B-12 (CYANOCOBALAMIN) 1000 MCG tablet Take 1,000 mcg by mouth daily.    [provider]     Allergies Penicillins  Family History  Problem Relation Age of Onset  . CAD Father     Social History Social History   Tobacco Use  . Smoking status: Never Smoker  . Smokeless tobacco: Never Used  Substance Use Topics  . Alcohol use: No  . Drug use: No  Review of Systems  Constitutional: No dizziness Eyes: No visual changes.  ENT: No  neck pain Cardiovascular: Denies chest pain. Respiratory: Denies shortness of breath. Gastrointestinal: No abdominal pain.    Genitourinary: Negative for dysuria. Musculoskeletal: No external knee injury or back pain Skin: Negative for rash. Neurological: Negative for headaches    ____________________________________________   PHYSICAL EXAM:  VITAL SIGNS: ED Triage Vitals [10/25/17 1419]  Enc Vitals Group     BP (!) 143/85     Pulse Rate 92     Resp 20     Temp 98.5 F (36.9 C)     Temp Source Oral     SpO2 91 %     Weight      Height      Head Circumference       Peak Flow      Pain Score      Pain Loc      Pain Edu?      Excl. in [place]?     Constitutional: Alert and oriented. No acute distress.  Hard of hearing Eyes: Conjunctivae are normal.  Head: Minimal abrasion left forehead, no hematoma Nose: No congestion/rhinnorhea. Mouth/Throat: Mucous membranes are moist.   Neck:  Painless ROM, no vertebral tenderness to palpation Cardiovascular: Normal rate, regular rhythm.   Good peripheral circulation. Respiratory: Normal respiratory effort.  No retractions. Lungs CTAB. Gastrointestinal: Soft and nontender. No distention.   Genitourinary: deferred Musculoskeletal: Significant lymphedema bilaterally, chronic apparently.  Full range of motion of the lower extremities and upper extremities, no pain with axial load on both hips.  No tenderness palpation of the spine Neurologic:  Normal speech and language. No gross focal neurologic deficits are appreciated.  Skin:  Skin is warm, dry and intact. No rash noted. Psychiatric: Mood and affect are normal. Speech and behavior are normal.  ____________________________________________   LABS (all labs ordered are listed, but only abnormal results are displayed)  Labs Reviewed  CBC - Abnormal; Notable for the following components:      Result Value   WBC 11.3 (*)    RBC 3.81 (*)    Hemoglobin 10.8 (*)    HCT 33.1 (*)    RDW 15.0 (*)    Platelets 148 (*)    All other components within normal limits  COMPREHENSIVE METABOLIC PANEL - Abnormal; Notable for the following components:   Glucose, Bld 164 (*)    BUN 31 (*)    Creatinine, Ser 1.88 (*)    Albumin 3.4 (*)    ALT 11 (*)    GFR calc non Af Amer 32 (*)    GFR calc Af Amer 37 (*)    All other components within normal limits  BRAIN NATRIURETIC PEPTIDE - Abnormal; Notable for the following components:   B Natriuretic Peptide 295.0 (*)    All other components within normal limits  TROPONIN I    ____________________________________________  EKG  None ____________________________________________  RADIOLOGY  Chest x-ray reviewed by me, no pulmonary edema noted CT head unremarkable ____________________________________________   PROCEDURES  Procedure(s) performed: No  Procedures   Critical Care performed: No ____________________________________________   INITIAL IMPRESSION / ASSESSMENT AND PLAN / ED COURSE  Pertinent labs & imaging results that were available during my care of the patient were reviewed by me and considered in my medical decision making (see chart for details).  Patient presents after a fall.  He describes his "legs giving out "which apparently happens frequently for him.  In reviewing medical records  he has been seen before for similar situation.  He denies injury from the fall.  No chest pain abdominal pain shortness of breath.  X-rays are unremarkable.  The patient is chronically on 2 L nasal cannula.  CT performed because of evidence of mild head injury and unclear description of the fall was normal.  Lab work is at the patient's baseline.  Feel he is appropriate for discharge at this point we will ambulate the patient prior to discharge with a walker    ____________________________________________   FINAL CLINICAL IMPRESSION(S) / ED DIAGNOSES  Final diagnoses:  Minor head injury, initial encounter  Fall, initial encounter        Note:  This document was prepared using Dragon voice recognition software and may include unintentional dictation errors.    Lavonia Drafts, MD 10/25/17 [phone]

## 2017-10-25 NOTE — ED Notes (Addendum)
Pt alert and oriented X4, active, cooperative, pt in NAD. RR even and unlabored, color WNL.  Pt informed to return if any life threatening symptoms occur.  Discharge and followup instructions reviewed.   Pt taken out to car by family via wheelchair. Pt has portable oxygen on. Offered assistance, family denies.

## 2017-10-28 ENCOUNTER — Encounter: Payer: Medicare Other | Admitting: Occupational Therapy

## 2017-10-29 ENCOUNTER — Encounter: Payer: Medicare Other | Admitting: Occupational Therapy

## 2017-10-31 ENCOUNTER — Encounter: Payer: Medicare Other | Admitting: Occupational Therapy

## 2017-12-09 ENCOUNTER — Encounter (INDEPENDENT_AMBULATORY_CARE_PROVIDER_SITE_OTHER): Payer: Medicare Other

## 2017-12-11 ENCOUNTER — Inpatient Hospital Stay
Admission: EM | Admit: 2017-12-11 | Discharge: 2017-12-19 | DRG: 291 | Disposition: A | Discharge status: Skilled Nursing Facility | Payer: Medicare Other | Attending: Internal Medicine | Admitting: Internal Medicine

## 2017-12-11 ENCOUNTER — Emergency Department: Payer: Medicare Other

## 2017-12-11 ENCOUNTER — Encounter: Payer: Self-pay | Admitting: Emergency Medicine

## 2017-12-11 ENCOUNTER — Other Ambulatory Visit: Payer: Self-pay

## 2017-12-11 DIAGNOSIS — J441 Chronic obstructive pulmonary disease with (acute) exacerbation: Secondary | ICD-10-CM | POA: Diagnosis present

## 2017-12-11 DIAGNOSIS — H9190 Unspecified hearing loss, unspecified ear: Secondary | ICD-10-CM | POA: Diagnosis present

## 2017-12-11 DIAGNOSIS — D631 Anemia in chronic kidney disease: Secondary | ICD-10-CM | POA: Diagnosis present

## 2017-12-11 DIAGNOSIS — Z9981 Dependence on supplemental oxygen: Secondary | ICD-10-CM

## 2017-12-11 DIAGNOSIS — E1165 Type 2 diabetes mellitus with hyperglycemia: Secondary | ICD-10-CM | POA: Diagnosis present

## 2017-12-11 DIAGNOSIS — D649 Anemia, unspecified: Secondary | ICD-10-CM

## 2017-12-11 DIAGNOSIS — R809 Proteinuria, unspecified: Secondary | ICD-10-CM

## 2017-12-11 DIAGNOSIS — Z79899 Other long term (current) drug therapy: Secondary | ICD-10-CM

## 2017-12-11 DIAGNOSIS — T380X5A Adverse effect of glucocorticoids and synthetic analogues, initial encounter: Secondary | ICD-10-CM | POA: Diagnosis present

## 2017-12-11 DIAGNOSIS — I130 Hypertensive heart and chronic kidney disease with heart failure and stage 1 through stage 4 chronic kidney disease, or unspecified chronic kidney disease: Secondary | ICD-10-CM | POA: Diagnosis not present

## 2017-12-11 DIAGNOSIS — J9611 Chronic respiratory failure with hypoxia: Secondary | ICD-10-CM | POA: Diagnosis present

## 2017-12-11 DIAGNOSIS — Z9841 Cataract extraction status, right eye: Secondary | ICD-10-CM | POA: Diagnosis not present

## 2017-12-11 DIAGNOSIS — Z6841 Body Mass Index (BMI) 40.0 and over, adult: Secondary | ICD-10-CM | POA: Diagnosis not present

## 2017-12-11 DIAGNOSIS — R0902 Hypoxemia: Secondary | ICD-10-CM

## 2017-12-11 DIAGNOSIS — E1122 Type 2 diabetes mellitus with diabetic chronic kidney disease: Secondary | ICD-10-CM | POA: Diagnosis present

## 2017-12-11 DIAGNOSIS — D696 Thrombocytopenia, unspecified: Secondary | ICD-10-CM | POA: Diagnosis present

## 2017-12-11 DIAGNOSIS — Z9842 Cataract extraction status, left eye: Secondary | ICD-10-CM | POA: Diagnosis not present

## 2017-12-11 DIAGNOSIS — E875 Hyperkalemia: Secondary | ICD-10-CM | POA: Diagnosis not present

## 2017-12-11 DIAGNOSIS — Z7982 Long term (current) use of aspirin: Secondary | ICD-10-CM

## 2017-12-11 DIAGNOSIS — N183 Chronic kidney disease, stage 3 unspecified: Secondary | ICD-10-CM

## 2017-12-11 DIAGNOSIS — N179 Acute kidney failure, unspecified: Secondary | ICD-10-CM | POA: Diagnosis not present

## 2017-12-11 DIAGNOSIS — I5033 Acute on chronic diastolic (congestive) heart failure: Secondary | ICD-10-CM | POA: Diagnosis present

## 2017-12-11 DIAGNOSIS — R059 Cough, unspecified: Secondary | ICD-10-CM

## 2017-12-11 DIAGNOSIS — Z96652 Presence of left artificial knee joint: Secondary | ICD-10-CM | POA: Diagnosis present

## 2017-12-11 DIAGNOSIS — I890 Lymphedema, not elsewhere classified: Secondary | ICD-10-CM | POA: Diagnosis present

## 2017-12-11 DIAGNOSIS — R05 Cough: Secondary | ICD-10-CM

## 2017-12-11 DIAGNOSIS — N189 Chronic kidney disease, unspecified: Secondary | ICD-10-CM

## 2017-12-11 DIAGNOSIS — I509 Heart failure, unspecified: Secondary | ICD-10-CM

## 2017-12-11 DIAGNOSIS — N171 Acute kidney failure with acute cortical necrosis: Secondary | ICD-10-CM

## 2017-12-11 LAB — BASIC METABOLIC PANEL
ANION GAP: 7 (ref 5–15)
BUN: 39 mg/dL — ABNORMAL HIGH (ref 6–20)
CALCIUM: 8.7 mg/dL — AB (ref 8.9–10.3)
CO2: 27 mmol/L (ref 22–32)
Chloride: 107 mmol/L (ref 101–111)
Creatinine, Ser: 2.38 mg/dL — ABNORMAL HIGH (ref 0.61–1.24)
GFR, EST AFRICAN AMERICAN: 28 mL/min — AB (ref >60)
GFR, EST NON AFRICAN AMERICAN: 24 mL/min — AB (ref >60)
Glucose, Bld: 139 mg/dL — ABNORMAL HIGH (ref 65–99)
Potassium: 4.9 mmol/L (ref 3.5–5.1)
Sodium: 141 mmol/L (ref 135–145)

## 2017-12-11 LAB — TROPONIN I

## 2017-12-11 LAB — INFLUENZA PANEL BY PCR (TYPE A & B)
INFLAPCR: NEGATIVE
Influenza B By PCR: NEGATIVE

## 2017-12-11 LAB — BRAIN NATRIURETIC PEPTIDE: B NATRIURETIC PEPTIDE 5: 362 pg/mL — AB (ref 0.0–100.0)

## 2017-12-11 LAB — CBC WITH DIFFERENTIAL/PLATELET
BASOS ABS: 0 10*3/uL (ref 0–0.1)
BASOS PCT: 1 %
EOS PCT: 10 %
Eosinophils Absolute: 0.6 10*3/uL (ref 0–0.7)
HCT: 30.8 % — ABNORMAL LOW (ref 40.0–52.0)
Hemoglobin: 9.9 g/dL — ABNORMAL LOW (ref 13.0–18.0)
Lymphocytes Relative: 24 %
Lymphs Abs: 1.5 10*3/uL (ref 1.0–3.6)
MCH: 27.9 pg (ref 26.0–34.0)
MCHC: 32 g/dL (ref 32.0–36.0)
MCV: 87.1 fL (ref 80.0–100.0)
MONO ABS: 0.7 10*3/uL (ref 0.2–1.0)
Monocytes Relative: 11 %
NEUTROS ABS: 3.5 10*3/uL (ref 1.4–6.5)
Neutrophils Relative %: 54 %
PLATELETS: 128 10*3/uL — AB (ref 150–440)
RBC: 3.54 MIL/uL — ABNORMAL LOW (ref 4.40–5.90)
RDW: 15.6 % — AB (ref 11.5–14.5)
WBC: 6.4 10*3/uL (ref 3.8–10.6)

## 2017-12-11 LAB — HEMOGLOBIN A1C
HEMOGLOBIN A1C: 6.2 % — AB (ref 4.8–5.6)
Mean Plasma Glucose: 131.24 mg/dL

## 2017-12-11 LAB — GLUCOSE, CAPILLARY
GLUCOSE-CAPILLARY: 133 mg/dL — AB (ref 65–99)
GLUCOSE-CAPILLARY: 166 mg/dL — AB (ref 65–99)
Glucose-Capillary: 166 mg/dL — ABNORMAL HIGH (ref 65–99)

## 2017-12-11 MED ORDER — SODIUM CHLORIDE 0.9% FLUSH: 3.0000 mL | INTRAVENOUS | Status: DC | PRN

## 2017-12-11 MED ORDER — ONDANSETRON HCL 4 MG PO TABS: 4.0000 mg | ORAL_TABLET | Freq: Four times a day (QID) | ORAL | Status: DC | PRN

## 2017-12-11 MED ORDER — ACETAMINOPHEN 650 MG RE SUPP: 650.0000 mg | Freq: Four times a day (QID) | RECTAL | Status: DC | PRN

## 2017-12-11 MED ORDER — ORAL CARE MOUTH RINSE
15.0000 mL | Freq: Two times a day (BID) | OROMUCOSAL | Status: DC
  Administered 2017-12-11 – 2017-12-18 (×8): 15 mL via OROMUCOSAL

## 2017-12-11 MED ORDER — SODIUM CHLORIDE 0.9% FLUSH: 3.0000 mL | Freq: Two times a day (BID) | INTRAVENOUS | Status: DC

## 2017-12-11 MED ORDER — POLYETHYLENE GLYCOL 3350 17 G PO PACK
17.0000 g | PACK | Freq: Every day | ORAL | Status: DC | PRN
  Administered 2017-12-12: 17 g via ORAL
  Filled 2017-12-11: qty 1

## 2017-12-11 MED ORDER — ACETAMINOPHEN 325 MG PO TABS: 650.0000 mg | ORAL_TABLET | Freq: Four times a day (QID) | ORAL | Status: DC | PRN

## 2017-12-11 MED ORDER — AMLODIPINE BESYLATE 5 MG PO TABS
5.0000 mg | ORAL_TABLET | Freq: Every day | ORAL | Status: DC
  Administered 2017-12-11 – 2017-12-16 (×6): 5 mg via ORAL
  Filled 2017-12-11 (×6): qty 1

## 2017-12-11 MED ORDER — CALCIUM CARBONATE ANTACID 500 MG PO CHEW: 1.0000 | CHEWABLE_TABLET | Freq: Three times a day (TID) | ORAL | Status: DC | PRN

## 2017-12-11 MED ORDER — FINASTERIDE 5 MG PO TABS
5.0000 mg | ORAL_TABLET | Freq: Every day | ORAL | Status: DC
  Administered 2017-12-11 – 2017-12-19 (×9): 5 mg via ORAL
  Filled 2017-12-11 (×9): qty 1

## 2017-12-11 MED ORDER — MIRTAZAPINE 15 MG PO TABS
15.0000 mg | ORAL_TABLET | Freq: Every day | ORAL | Status: DC
  Administered 2017-12-11 – 2017-12-18 (×8): 15 mg via ORAL
  Filled 2017-12-11 (×8): qty 1

## 2017-12-11 MED ORDER — TAMSULOSIN HCL 0.4 MG PO CAPS
0.4000 mg | ORAL_CAPSULE | Freq: Every day | ORAL | Status: DC
  Administered 2017-12-11 – 2017-12-18 (×8): 0.4 mg via ORAL
  Filled 2017-12-11 (×8): qty 1

## 2017-12-11 MED ORDER — ONDANSETRON HCL 4 MG/2ML IJ SOLN: 4.0000 mg | Freq: Four times a day (QID) | INTRAMUSCULAR | Status: DC | PRN

## 2017-12-11 MED ORDER — PANTOPRAZOLE SODIUM 40 MG PO TBEC
40.0000 mg | DELAYED_RELEASE_TABLET | Freq: Every day | ORAL | Status: DC
  Administered 2017-12-11 – 2017-12-19 (×9): 40 mg via ORAL
  Filled 2017-12-11 (×9): qty 1

## 2017-12-11 MED ORDER — GUAIFENESIN-CODEINE 100-10 MG/5ML PO SOLN
5.0000 mL | ORAL | Status: DC | PRN
  Administered 2017-12-12 – 2017-12-19 (×10): 5 mL via ORAL
  Filled 2017-12-11 (×12): qty 5

## 2017-12-11 MED ORDER — ALBUTEROL SULFATE (2.5 MG/3ML) 0.083% IN NEBU
2.5000 mg | INHALATION_SOLUTION | RESPIRATORY_TRACT | Status: DC | PRN
Start: 2017-12-11 — End: 2017-12-19
  Administered 2017-12-15 – 2017-12-17 (×2): 2.5 mg via RESPIRATORY_TRACT
  Filled 2017-12-11 (×2): qty 3

## 2017-12-11 MED ORDER — IPRATROPIUM-ALBUTEROL 0.5-2.5 (3) MG/3ML IN SOLN
3.0000 mL | Freq: Four times a day (QID) | RESPIRATORY_TRACT | Status: DC
  Administered 2017-12-11 – 2017-12-19 (×31): 3 mL via RESPIRATORY_TRACT
  Filled 2017-12-11 (×32): qty 3

## 2017-12-11 MED ORDER — HYDRALAZINE HCL 20 MG/ML IJ SOLN: 10.0000 mg | Freq: Four times a day (QID) | INTRAMUSCULAR | Status: DC | PRN

## 2017-12-11 MED ORDER — INSULIN ASPART 100 UNIT/ML ~~LOC~~ SOLN
0.0000 [IU] | Freq: Every day | SUBCUTANEOUS | Status: DC
  Administered 2017-12-12: 3 [IU] via SUBCUTANEOUS
  Administered 2017-12-14: 2 [IU] via SUBCUTANEOUS
  Filled 2017-12-11 (×2): qty 1

## 2017-12-11 MED ORDER — ASPIRIN EC 81 MG PO TBEC
81.0000 mg | DELAYED_RELEASE_TABLET | Freq: Every day | ORAL | Status: DC
  Administered 2017-12-11 – 2017-12-19 (×9): 81 mg via ORAL
  Filled 2017-12-11 (×9): qty 1

## 2017-12-11 MED ORDER — SODIUM CHLORIDE 0.9% FLUSH
3.0000 mL | Freq: Two times a day (BID) | INTRAVENOUS | Status: DC
  Administered 2017-12-11 – 2017-12-19 (×17): 3 mL via INTRAVENOUS

## 2017-12-11 MED ORDER — INSULIN ASPART 100 UNIT/ML ~~LOC~~ SOLN
0.0000 [IU] | Freq: Three times a day (TID) | SUBCUTANEOUS | Status: DC
  Administered 2017-12-11: 3 [IU] via SUBCUTANEOUS
  Administered 2017-12-11 – 2017-12-12 (×3): 2 [IU] via SUBCUTANEOUS
  Administered 2017-12-12 – 2017-12-13 (×2): 5 [IU] via SUBCUTANEOUS
  Administered 2017-12-13: 2 [IU] via SUBCUTANEOUS
  Administered 2017-12-13 – 2017-12-14 (×2): 3 [IU] via SUBCUTANEOUS
  Administered 2017-12-14: 2 [IU] via SUBCUTANEOUS
  Administered 2017-12-14: 5 [IU] via SUBCUTANEOUS
  Administered 2017-12-15 (×2): 3 [IU] via SUBCUTANEOUS
  Administered 2017-12-15: 2 [IU] via SUBCUTANEOUS
  Administered 2017-12-16: 5 [IU] via SUBCUTANEOUS
  Administered 2017-12-16: 3 [IU] via SUBCUTANEOUS
  Administered 2017-12-16 – 2017-12-17 (×2): 2 [IU] via SUBCUTANEOUS
  Administered 2017-12-17: 3 [IU] via SUBCUTANEOUS
  Administered 2017-12-17: 5 [IU] via SUBCUTANEOUS
  Administered 2017-12-18: 2 [IU] via SUBCUTANEOUS
  Administered 2017-12-18 – 2017-12-19 (×2): 3 [IU] via SUBCUTANEOUS
  Administered 2017-12-19: 2 [IU] via SUBCUTANEOUS
  Filled 2017-12-11 (×24): qty 1

## 2017-12-11 MED ORDER — FUROSEMIDE 10 MG/ML IJ SOLN
60.0000 mg | Freq: Two times a day (BID) | INTRAMUSCULAR | Status: DC
  Administered 2017-12-11 – 2017-12-12 (×4): 60 mg via INTRAVENOUS
  Filled 2017-12-11 (×5): qty 6

## 2017-12-11 MED ORDER — FUROSEMIDE 10 MG/ML IJ SOLN
40.0000 mg | Freq: Once | INTRAMUSCULAR | Status: AC
  Administered 2017-12-11: 40 mg via INTRAVENOUS
  Filled 2017-12-11: qty 4

## 2017-12-11 MED ORDER — POTASSIUM CHLORIDE CRYS ER 20 MEQ PO TBCR
20.0000 meq | EXTENDED_RELEASE_TABLET | Freq: Every day | ORAL | Status: DC
  Administered 2017-12-11 – 2017-12-13 (×3): 20 meq via ORAL
  Filled 2017-12-11 (×3): qty 1

## 2017-12-11 MED ORDER — CHLORHEXIDINE GLUCONATE 0.12 % MT SOLN
15.0000 mL | Freq: Two times a day (BID) | OROMUCOSAL | Status: DC
  Administered 2017-12-11 – 2017-12-19 (×16): 15 mL via OROMUCOSAL
  Filled 2017-12-11 (×17): qty 15

## 2017-12-11 MED ORDER — METHYLPREDNISOLONE SODIUM SUCC 125 MG IJ SOLR
60.0000 mg | Freq: Every day | INTRAMUSCULAR | Status: DC
  Administered 2017-12-11 – 2017-12-13 (×3): 60 mg via INTRAVENOUS
  Filled 2017-12-11 (×3): qty 2

## 2017-12-11 MED ORDER — HEPARIN SODIUM (PORCINE) 5000 UNIT/ML IJ SOLN
5000.0000 [IU] | Freq: Three times a day (TID) | INTRAMUSCULAR | Status: DC
  Administered 2017-12-11 – 2017-12-19 (×24): 5000 [IU] via SUBCUTANEOUS
  Filled 2017-12-11 (×24): qty 1

## 2017-12-11 NOTE — Progress Notes (Signed)
*   Acute on chronic diastolic chf IV lasix I/os.  * AKI over CKD3 Monitor while being diuresed  2-3 days

## 2017-12-11 NOTE — ED Triage Notes (Addendum)
Pt presents to ED via EMS from home with worsening cough and congestion. Green Yellow sputum for the past 3 days. O2 sats 95% RA. VS WNL. Pt able to speak and answer questions at this time. No increased work of breathing noted. Pt also states his groin is swollen for the past 2 days. No hx of the same.

## 2017-12-11 NOTE — Progress Notes (Signed)
Patient requesting change in CODE STATUS to full code. we will update that

## 2017-12-11 NOTE — ED Notes (Signed)
Pt being transported to rm 248 by this tech at this time.

## 2017-12-11 NOTE — ED Provider Notes (Signed)
[facility] Emergency Department Provider Note  ____________________________________________  Time seen: Approximately 7:32 AM  I have reviewed the triage vital signs and the nursing notes.   HISTORY  Chief Complaint Cough; Nasal Congestion; and Groin Swelling   HPI James Collins is a 83 y.o. male history of diastolic CHF, chronic kidney disease, diabetes, hypertension, lymphedema who presents for evaluation of shortness of breath and groin swelling. Patient endorses congestion and a cough productive of green sputum for the last three days. For the last 2 days patient has had progressively worsening shortness of breath which is now moderate and constant at rest and severe with minimal exertion. According to the family patient has been unable to walk due to SOB and generalized weakness/ fatigue. He is on 2 L nasal cannula at baseline and has not increased that at home. Patient reports significant swelling of his scrotumwhich has been progressive over 2 days. Has not weighed himself for the last few days but has noted weight gain. No cp, no fever, no chills, no vomiting, no diarrhea.   Past Medical History:  Diagnosis Date  . CHF (congestive heart failure) ([place])   . Chronic kidney disease   . COPD (chronic obstructive pulmonary disease) ([place])   . Diabetes mellitus without complication ([place])   . Hypertension   . Lymphedema     Patient Active Problem List   Diagnosis Date Noted  . Chronic diastolic heart failure ([place]) 07/29/2017  . HTN (hypertension) 07/29/2017  . Lymphedema 07/29/2017  . Cellulitis 07/09/2017  . Pressure injury of skin 07/09/2017  . 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 CATARACT EXTRACTION, BILATERAL Bilateral   . REPLACEMENT TOTAL KNEE Left     Prior to Admission medications   Medication Sig Start Date End Date Taking? Authorizing Provider  amLODipine (NORVASC) 5 MG tablet Take 5 mg by  mouth daily.     [provider]  aspirin EC 81 MG tablet Take 81 mg by mouth daily.    [provider]  benazepril (LOTENSIN) 20 MG tablet Take 20 mg by mouth daily.     [provider]  calcium carbonate (TUMS - DOSED IN MG ELEMENTAL CALCIUM) 500 MG chewable tablet Chew 1 tablet by mouth 3 (three) times daily as needed for indigestion or heartburn.    [provider]  Cholecalciferol (D3-1000) 1000 units capsule Take 1,000 Units by mouth daily.    [provider]  finasteride (PROSCAR) 5 MG tablet Take 5 mg by mouth daily.     [provider]  furosemide (LASIX) 20 MG tablet Take 1 tablet (20 mg total) by mouth daily. 07/11/17 07/11/18  Vaughan Basta, MD  loratadine (CLARITIN) 10 MG tablet Take 1 tablet by mouth daily as needed for allergies.     [provider]  mirtazapine (REMERON) 15 MG tablet Take 15 mg at bedtime by mouth.    [provider]  omeprazole (PRILOSEC) 40 MG capsule Take 40 mg by mouth daily. 11/20/17   [provider]  potassium chloride SA (K-DUR,KLOR-CON) 20 MEQ tablet Take 1 tablet (20 mEq total) by mouth daily. 07/11/17   Vaughan Basta, MD  ranitidine (ZANTAC) 150 MG tablet Take 150 mg daily as needed by mouth for heartburn.    [provider]  tamsulosin (FLOMAX) 0.4 MG CAPS capsule Take 0.4 mg by mouth at bedtime. 11/20/17   [provider]  vitamin B-12 (CYANOCOBALAMIN) 1000 MCG tablet Take  1,000 mcg by mouth daily.    [provider]    Allergies Penicillins  Family History  Problem Relation Age of Onset  . CAD Father     Social History Social History   Tobacco Use  . Smoking status: Never Smoker  . Smokeless tobacco: Never Used  Substance Use Topics  . Alcohol use: No  . Drug use: No    Review of Systems  Constitutional: Negative for fever. Eyes: Negative for visual changes. ENT: Negative for sore throat. + congestion Neck:  No neck pain  Cardiovascular: Negative for chest pain. Respiratory: + shortness of breath, cough Gastrointestinal: Negative for abdominal pain, vomiting or diarrhea. Genitourinary: Negative for dysuria. + scrotal swelling Musculoskeletal: Negative for back pain. Skin: Negative for rash. Neurological: Negative for headaches, weakness or numbness. Psych: No SI or HI  ____________________________________________   PHYSICAL EXAM:  VITAL SIGNS: ED Triage Vitals  Enc Vitals Group     BP --      Pulse Rate 12/11/17 0626 80     Resp 12/11/17 0626 20     Temp 12/11/17 0626 (!) 97.4 F (36.3 C)     Temp Source 12/11/17 0626 Oral     SpO2 12/11/17 0626 97 %     Weight 12/11/17 0629 (!) 307 lb (139.3 kg)     Height 12/11/17 0629 5\' 9"  (1.753 m)     Head Circumference --      Peak Flow --      Pain Score --      Pain Loc --      Pain Edu? --      Excl. in [place]? --     Constitutional: Alert and oriented, mild respiratory distress.  HEENT:      Head: Normocephalic and atraumatic.         Eyes: Conjunctivae are normal. Sclera is non-icteric.       Mouth/Throat: Mucous membranes are moist.       Neck: Supple with no signs of meningismus. Cardiovascular: Regular rate and rhythm. No murmurs, gallops, or rubs. 2+ symmetrical distal pulses are present in all extremities. No JVD. Respiratory: increased work of breathing, satting 97% on 2 L, diffuse coarse rhonchi and wheezes throughout Gastrointestinal: Soft, non tender, and non distended with positive bowel sounds. No rebound or guarding. Moderate swelling of testicles and penis Musculoskeletal: chronic lymphedema Neurologic: Normal speech and language. Face is symmetric. Moving all extremities. No gross focal neurologic deficits are appreciated. Skin: Skin is warm, dry and intact. No rash noted. Psychiatric: Mood and affect are normal. Speech and behavior are normal.  ____________________________________________   LABS (all labs ordered  are listed, but only abnormal results are displayed)  Labs Reviewed  CBC WITH DIFFERENTIAL/PLATELET - Abnormal; Notable for the following components:      Result Value   RBC 3.54 (*)    Hemoglobin 9.9 (*)    HCT 30.8 (*)    RDW 15.6 (*)    Platelets 128 (*)    All other components within normal limits  BASIC METABOLIC PANEL - Abnormal; Notable for the following components:   Glucose, Bld 139 (*)    BUN 39 (*)    Creatinine, Ser 2.38 (*)    Calcium 8.7 (*)    GFR calc non Af Amer 24 (*)    GFR calc Af Amer 28 (*)    All other components within normal limits  BRAIN NATRIURETIC PEPTIDE - Abnormal; Notable for the following components:   B Natriuretic  Peptide 362.0 (*)    All other components within normal limits  TROPONIN I  INFLUENZA PANEL BY PCR (TYPE A & B)   ____________________________________________  EKG  ED ECG REPORT I, Rudene Re, the attending physician, personally viewed and interpreted this ECG.  Junctional rhythm, rate of 82, borderline left bundle branch block, normal QTC, right axis deviation, no ST elevations or depressions.unchanged from prior from October 2018 ____________________________________________  RADIOLOGY  I have personally reviewed the images performed during this visit and I agree with the Radiologist's read.   Interpretation by Radiologist:  Dg Chest 2 View  Result Date: 12/11/2017 CLINICAL DATA:  Cough and shortness of breath EXAM: CHEST - 2 VIEW COMPARISON:  October 25, 2017 FINDINGS: There is no edema or consolidation. There is cardiomegaly with pulmonary venous hypertension. No adenopathy. There is degenerative change in the thoracic spine. IMPRESSION: Cardiomegaly with a degree of pulmonary vascular congestion. No edema or consolidation. Electronically Signed   By: Lowella Grip III M.D.   On: 12/11/2017 07:06     ____________________________________________   PROCEDURES  Procedure(s) performed: None Procedures Critical  Care performed:  None ____________________________________________   INITIAL IMPRESSION / ASSESSMENT AND PLAN / ED COURSE  83 y.o. male history of diastolic CHF, chronic kidney disease, diabetes, hypertension, lymphedema who presents for evaluation of shortness of breath and groin swelling.presentation concerning for acute CHF exacerbation, acute on chronic kidney injury, no acute on chronic anemia of chronic illness. Patient is not on any blood thinners and no active bleeding at this time.hemoglobin is 9.9 which has been downtrending since 07/2047. No indication for transfusion at this time. patient is afebrile with a normal white count, chest x-ray with no evidence of pneumonia. worsening creatinine to 2.38 (baseline 1.8). We'll start diuresing with 4 mg of IV Lasix. We'll admit to the hospitalist for further monitoring.      As part of my medical decision making, I reviewed the following data within the [facility] notes reviewed and incorporated, Labs reviewed , EKG interpreted , Old EKG reviewed, Old chart reviewed, Radiograph reviewed , Discussed with admitting physician , Notes from prior ED visits and  Controlled Substance Database    Pertinent labs & imaging results that were available during my care of the patient were reviewed by me and considered in my medical decision making (see chart for details).    ____________________________________________   FINAL CLINICAL IMPRESSION(S) / ED DIAGNOSES  Final diagnoses:  Acute on chronic congestive heart failure, unspecified heart failure type (HCC)  Acute on chronic anemia  Acute kidney injury superimposed on chronic kidney disease ([place])      NEW MEDICATIONS STARTED DURING THIS VISIT:  ED Discharge Orders    None       Note:  This document was prepared using Dragon voice recognition software and may include unintentional dictation errors.    Rudene Re, MD 12/11/17 [phone]

## 2017-12-11 NOTE — ED Notes (Signed)
Pt went to X-Ray.  

## 2017-12-11 NOTE — H&P (Addendum)
[facility] [place] NAME: James Collins    MR#:  [id number]  DATE OF BIRTH:  15-Apr-1935  DATE OF ADMISSION:  12/11/2017  PRIMARY CARE PHYSICIAN: Glendon Axe, MD   REQUESTING/REFERRING PHYSICIAN: Dr. Alfred Levins  CHIEF COMPLAINT:   Chief Complaint  Patient presents with  . Cough  . Nasal Congestion  . Groin Swelling    HISTORY OF PRESENT ILLNESS:  James Collins  is a 83 y.o. male with a known history of diastolic CHF, COPD, CKD stage III, diabetes, hypertension presents to the hospital complaining of worsening lower extremity swelling, weakness and shortness of breath over the last 3 days.  Patient has chronic shortness of breath, lower extremity swelling.  He has been having progressive weakness over the last 6 months.  He has significantly deteriorated over the last 3 days to the point that he was unable to ambulate on his own.  He has been ambulating with a walker at home over the past few months. Here patient has been found to have influenza negative, chest x-ray showing pulmonary edema.  Creatinine is worse than baseline.  PAST MEDICAL HISTORY:   Past Medical History:  Diagnosis Date  . CHF (congestive heart failure) ([place])   . Chronic kidney disease   . COPD (chronic obstructive pulmonary disease) ([place])   . Diabetes mellitus without complication ([place])   . Hypertension   . Lymphedema     PAST SURGICAL HISTORY:   Past Surgical History:  Procedure Laterality Date  . CATARACT EXTRACTION, BILATERAL Bilateral   . REPLACEMENT TOTAL KNEE Left     SOCIAL HISTORY:   Social History   Tobacco Use  . Smoking status: Never Smoker  . Smokeless tobacco: Never Used  Substance Use Topics  . Alcohol use: No    FAMILY HISTORY:   Family History  Problem Relation Age of Onset  . CAD Father     DRUG ALLERGIES:   Allergies  Allergen Reactions  . Penicillins     REVIEW OF SYSTEMS:   Review of Systems  Constitutional:  Positive for malaise/fatigue. Negative for chills and fever.  HENT: Negative for sore throat.   Eyes: Negative for blurred vision, double vision and pain.  Respiratory: Positive for cough, shortness of breath and wheezing. Negative for hemoptysis.   Cardiovascular: Negative for chest pain, palpitations, orthopnea and leg swelling.  Gastrointestinal: Negative for abdominal pain, constipation, diarrhea, heartburn, nausea and vomiting.  Genitourinary: Negative for dysuria and hematuria.  Musculoskeletal: Negative for back pain and joint pain.  Skin: Negative for rash.  Neurological: Positive for weakness. Negative for sensory change, speech change, focal weakness and headaches.  Endo/Heme/Allergies: Does not bruise/bleed easily.  Psychiatric/Behavioral: Negative for depression. The patient is not nervous/anxious.     MEDICATIONS AT HOME:   Prior to Admission medications   Medication Sig Start Date End Date Taking? Authorizing Provider  amLODipine (NORVASC) 5 MG tablet Take 5 mg by mouth daily.    Yes [provider]  aspirin EC 81 MG tablet Take 81 mg by mouth daily.   Yes [provider]  benazepril (LOTENSIN) 20 MG tablet Take 20 mg by mouth daily.    Yes [provider]  calcium carbonate (TUMS - DOSED IN MG ELEMENTAL CALCIUM) 500 MG chewable tablet Chew 1 tablet by mouth 3 (three) times daily as needed for indigestion or heartburn.   Yes [provider]  finasteride (PROSCAR) 5 MG tablet Take 5 mg by mouth daily.  Yes [provider]  furosemide (LASIX) 20 MG tablet Take 1 tablet (20 mg total) by mouth daily. 07/11/17 07/11/18 Yes Vaughan Basta, MD  loratadine (CLARITIN) 10 MG tablet Take 1 tablet by mouth daily as needed for allergies.    Yes [provider]  omeprazole (PRILOSEC) 40 MG capsule Take 40 mg by mouth daily. 11/20/17  Yes [provider]  potassium chloride SA (K-DUR,KLOR-CON) 20 MEQ tablet Take 1 tablet  (20 mEq total) by mouth daily. 07/11/17  Yes Vaughan Basta, MD  tamsulosin (FLOMAX) 0.4 MG CAPS capsule Take 0.4 mg by mouth at bedtime. 11/20/17  Yes [provider]  vitamin B-12 (CYANOCOBALAMIN) 1000 MCG tablet Take 1,000 mcg by mouth daily.   Yes [provider]     VITAL SIGNS:  Blood pressure (!) 167/65, pulse 79, temperature (!) 97.4 F (36.3 C), temperature source Oral, resp. rate 20, height 5\' 7"  (1.702 m), weight (!) 147.6 kg (325 lb 6.4 oz), SpO2 94 %.  PHYSICAL EXAMINATION:  Physical Exam  GENERAL:  83 y.o.-year-old patient lying in the bed with no acute distress.  Morbidly obese EYES: Pupils equal, round, reactive to light and accommodation. No scleral icterus. Extraocular muscles intact.  HEENT: Head atraumatic, normocephalic. Oropharynx and nasopharynx clear. No oropharyngeal erythema, moist oral mucosa  NECK:  Supple, no jugular venous distention. No thyroid enlargement, no tenderness.  LUNGS: Bilateral wheezing and crackles.  Decreased air entry. CARDIOVASCULAR: S1, S2 normal. No murmurs, rubs, or gallops.  ABDOMEN: Soft, nontender, nondistended. Bowel sounds present. No organomegaly or mass.  EXTREMITIES: bilateral lower examinee edema with keratosis.  Weeping skin. NEUROLOGIC: Cranial nerves II through XII are intact. No focal Motor or sensory deficits appreciated b/l PSYCHIATRIC: The patient is alert and oriented x 3. Good affect.  SKIN: No obvious rash, lesion, or ulcer.   LABORATORY PANEL:   CBC Recent Labs  Lab 12/11/17 0631  WBC 6.4  HGB 9.9*  HCT 30.8*  PLT 128*   ------------------------------------------------------------------------------------------------------------------  Chemistries  Recent Labs  Lab 12/11/17 0631  NA 141  K 4.9  CL 107  CO2 27  GLUCOSE 139*  BUN 39*  CREATININE 2.38*  CALCIUM 8.7*    ------------------------------------------------------------------------------------------------------------------  Cardiac Enzymes Recent Labs  Lab 12/11/17 0631  TROPONINI <0.03   ------------------------------------------------------------------------------------------------------------------  RADIOLOGY:  Dg Chest 2 View  Result Date: 12/11/2017 CLINICAL DATA:  Cough and shortness of breath EXAM: CHEST - 2 VIEW COMPARISON:  October 25, 2017 FINDINGS: There is no edema or consolidation. There is cardiomegaly with pulmonary venous hypertension. No adenopathy. There is degenerative change in the thoracic spine. IMPRESSION: Cardiomegaly with a degree of pulmonary vascular congestion. No edema or consolidation. Electronically Signed   By: Lowella Grip III M.D.   On: 12/11/2017 07:06     IMPRESSION AND PLAN:   * Acute on chronic diastolic congestive heart failure - IV Lasix, Beta blockers - Input and Output - Counseled to limit fluids and Salt - Monitor Bun/Cr and Potassium - Echo - Reviwed - Cardiology follow up after discharge  *Acute COPD exacerbation -IV steroids - Scheduled Nebulizers - Inhalers -Wean O2 as tolerated - Consult pulmonary if no improvement Influenza negative  *Diabetes mellitus.  Sliding scale insulin.  *Acute kidney injury over CKD stage III.  Monitor while being diuresed.  Repeat labs in the morning.  Monitor input and output.  *DVT prophylaxis with heparin subcutaneous  All the records are reviewed and case discussed with ED provider. Management plans discussed with the patient,  family and they are in agreement.  CODE STATUS: DNR  TOTAL TIME TAKING CARE OF THIS PATIENT: 35 minutes.   Neita Carp M.D on 12/11/2017 at 1:03 PM  Between 7am to 6pm - Pager - [phone]  After 6pm go to [email or web address] - password [email or web address] [facility]  Office  [phone]  CC: Primary care physician; Glendon Axe, MD  Note: This  dictation was prepared with Dragon dictation along with smaller phrase technology. Any transcriptional errors that result from this process are unintentional.

## 2017-12-11 NOTE — Care Management (Addendum)
Patient has chronic oxygen with [facility].  has been followed by [facility] in the past. Uses [facility] for transportation to appointments.  Has scales but does not weigh every day. Has a wheel chair and walker. His PCP is Glendon Axe since Dr Brynda Greathouse retired.  Patient does have chronic lymphedema and it is report he has severe scrotal edema. Found that patient is currently followed by [facility] RN PT Aide.  Patient has not been seen since the end of February because no answer to phone calls.  One drive by and no one answered the door

## 2017-12-11 NOTE — Progress Notes (Addendum)
Skin assessment.  Patient has gross lymphedema of the lower extremities with anasarca type superficial nodules. An few of the nodules on his right lateral leg have opened and are oozing blood. Patient states this is of long duration. No MASD in groin folds.  Skin on sacrum is intact.

## 2017-12-11 NOTE — Progress Notes (Signed)
His legs are so large I think putting ted hose would be difficult.  I put SCDs on him.  He says it feels good.

## 2017-12-11 NOTE — Progress Notes (Signed)
PT Cancellation Note  Patient Details Name: James Collins MRN: [id number] DOB: Mar 09, 1935   Cancelled Treatment:    Reason Eval/Treat Not Completed: Patient declined, no reason specified(Consult received and chart reviewed. Patient politely declining participation at this time, "just don't feel up to it".  Endorses continued SOB, generalized weakness; does not feel he can tolerate mobility this date.  Agreeable to re-attempt next date.)   Of note, patient reports living with son and son's girlfriend in single-story home with 4 steps (bilat rails, close enough) to enter/exit. Ambulatory for limited household distances; does endorse multiple fall history.  [name] H. Owens Shark, PT, DPT, NCS 12/11/17, 2:25 PM [phone]

## 2017-12-12 LAB — CBC
HEMATOCRIT: 26.3 % — AB (ref 40.0–52.0)
Hemoglobin: 8.4 g/dL — ABNORMAL LOW (ref 13.0–18.0)
MCH: 27.5 pg (ref 26.0–34.0)
MCHC: 31.8 g/dL — AB (ref 32.0–36.0)
MCV: 86.3 fL (ref 80.0–100.0)
Platelets: 117 10*3/uL — ABNORMAL LOW (ref 150–440)
RBC: 3.05 MIL/uL — ABNORMAL LOW (ref 4.40–5.90)
RDW: 15.7 % — AB (ref 11.5–14.5)
WBC: 6.3 10*3/uL (ref 3.8–10.6)

## 2017-12-12 LAB — BASIC METABOLIC PANEL
Anion gap: 9 (ref 5–15)
BUN: 45 mg/dL — AB (ref 6–20)
CHLORIDE: 106 mmol/L (ref 101–111)
CO2: 27 mmol/L (ref 22–32)
Calcium: 8.4 mg/dL — ABNORMAL LOW (ref 8.9–10.3)
Creatinine, Ser: 2.46 mg/dL — ABNORMAL HIGH (ref 0.61–1.24)
GFR calc Af Amer: 27 mL/min — ABNORMAL LOW (ref >60)
GFR calc non Af Amer: 23 mL/min — ABNORMAL LOW (ref >60)
GLUCOSE: 171 mg/dL — AB (ref 65–99)
Potassium: 4.8 mmol/L (ref 3.5–5.1)
Sodium: 142 mmol/L (ref 135–145)

## 2017-12-12 LAB — GLUCOSE, CAPILLARY
Glucose-Capillary: 135 mg/dL — ABNORMAL HIGH (ref 65–99)
Glucose-Capillary: 144 mg/dL — ABNORMAL HIGH (ref 65–99)
Glucose-Capillary: 218 mg/dL — ABNORMAL HIGH (ref 65–99)
Glucose-Capillary: 170 mg/dL — ABNORMAL HIGH (ref 65–99)

## 2017-12-12 MED ORDER — FUROSEMIDE 20 MG PO TABS
20.0000 mg | ORAL_TABLET | Freq: Two times a day (BID) | ORAL | Status: DC
  Administered 2017-12-13: 20 mg via ORAL
  Filled 2017-12-12: qty 1

## 2017-12-12 NOTE — Progress Notes (Signed)
Got patient up to chair using ceiling lift. He said he was uncomfortable being in bed for so long.  Now states he wants to stay in the chair and not go back to the bed... "He has slept in the recliner at home for 20 years."  Asked him if he ever gets out of the chair at home and he said yes he gets up and walks around.  Reminded him that  At this time he is unable to walk around so needs to be in a bed so we can clean him up and reposition him.   He was annoyed by this answer.  He says the bariatric bed doesn't sit him up high enough to eat and the regular bed is uncomfortable on his back.  Told him he could eat in the chair when possible.

## 2017-12-12 NOTE — Progress Notes (Addendum)
[facility] [place] NAME: James Collins    MR#:  [id number]  DATE OF BIRTH:  Jul 20, 1935  SUBJECTIVE:  CHIEF COMPLAINT:  pts sob is better, ch lives on 2 lit o2 and now on 3 lit.  Patient sees Dr. Clayborn Bigness as an outpatient  REVIEW OF SYSTEMS:  CONSTITUTIONAL: No fever, fatigue or weakness.  EYES: No blurred or double vision.  EARS, NOSE, AND THROAT: No tinnitus or ear pain.  RESPIRATORY: No cough, reports improving shortness of breath, denies wheezing or hemoptysis.  CARDIOVASCULAR: No chest pain, orthopnea, edema.  GASTROINTESTINAL: No nausea, vomiting, diarrhea or abdominal pain.  GENITOURINARY: No dysuria, hematuria.  ENDOCRINE: No polyuria, nocturia,  HEMATOLOGY: No anemia, easy bruising or bleeding SKIN: No rash or lesion. MUSCULOSKELETAL: No joint pain or arthritis.   NEUROLOGIC: No tingling, numbness, weakness.  PSYCHIATRY: No anxiety or depression.   DRUG ALLERGIES:   Allergies  Allergen Reactions  . Penicillins     VITALS:  Blood pressure (!) 155/67, pulse 79, temperature 98.1 F (36.7 C), temperature source Oral, resp. rate 20, height 5\' 7"  (1.702 m), weight (!) 146.3 kg (322 lb 8.5 oz), SpO2 93 %.  PHYSICAL EXAMINATION:  GENERAL:  83 y.o.-year-old patient lying in the bed with no acute distress.  EYES: Pupils equal, round, reactive to light and accommodation. No scleral icterus. Extraocular muscles intact.  HEENT: Head atraumatic, normocephalic. Oropharynx and nasopharynx clear.  NECK:  Supple, no jugular venous distention. No thyroid enlargement, no tenderness.  LUNGS:  moderate breath sounds bilaterally, no wheezing, rales,rhonchi or crepitation. No use of accessory muscles of respiration.  CARDIOVASCULAR: S1, S2 normal. No murmurs, rubs, or gallops.  ABDOMEN: Soft, nontender, nondistended. Bowel sounds present. No organomegaly or mass.  EXTREMITIES: No pedal edema, cyanosis, or clubbing.  NEUROLOGIC:  Cranial nerves II through XII are intact. Muscle strength 5/5 in all extremities. Sensation intact. Gait not checked.  PSYCHIATRIC: The patient is alert and oriented x 3.  SKIN: No obvious rash, lesion, or ulcer.    LABORATORY PANEL:   CBC Recent Labs  Lab 12/12/17 0529  WBC 6.3  HGB 8.4*  HCT 26.3*  PLT 117*   ------------------------------------------------------------------------------------------------------------------  Chemistries  Recent Labs  Lab 12/12/17 0529  NA 142  K 4.8  CL 106  CO2 27  GLUCOSE 171*  BUN 45*  CREATININE 2.46*  CALCIUM 8.4*   ------------------------------------------------------------------------------------------------------------------  Cardiac Enzymes Recent Labs  Lab 12/11/17 0631  TROPONINI <0.03   ------------------------------------------------------------------------------------------------------------------  RADIOLOGY:  Dg Chest 2 View  Result Date: 12/11/2017 CLINICAL DATA:  Cough and shortness of breath EXAM: CHEST - 2 VIEW COMPARISON:  October 25, 2017 FINDINGS: There is no edema or consolidation. There is cardiomegaly with pulmonary venous hypertension. No adenopathy. There is degenerative change in the thoracic spine. IMPRESSION: Cardiomegaly with a degree of pulmonary vascular congestion. No edema or consolidation. Electronically Signed   By: Lowella Grip III M.D.   On: 12/11/2017 07:06    EKG:   Orders placed or performed during the hospital encounter of 12/11/17  . ED EKG  . ED EKG    ASSESSMENT AND PLAN:    * Acute on chronic diastolic congestive heart failure -Clinically improving - IV Lasix 60 mg twice daily was started during this admission, patient is clinically improving and renal function is getting worse will change to Lasix 20 mg p.o. twice daily.  Patient takes 20 mg p.o. once daily at home.  Continue beta blockers -  In take and Output and daily weight monitoring - Counseled to limit fluids and  Salt - Monitor Bun/Cr and Potassium - Echo -from October 2018 ejection fraction 65-70%, normal overall wall motion, normal study - Cardiology follow up after discharge  *Acute COPD exacerbation -IV steroids - Scheduled Nebulizers - Inhalers -Wean O2 as tolerated - Consult pulmonary if no improvement Influenza negative  *Diabetes mellitus.  Sliding scale insulin.  *Acute kidney injury over CKD stage III.  Monitor while being diuresed.   Creatinine baseline seems to be at 1.8, during this admission  2.38-2.46  repeat labs in the morning.  Monitor input and output.  *DVT prophylaxis with heparin subcutaneous      All the records are reviewed and case discussed with Care Management/Social Workerr. Management plans discussed with the patient, family and they are in agreement.  CODE STATUS: fc  TOTAL TIME TAKING CARE OF THIS PATIENT: 35 minutes.   POSSIBLE D/C IN 1-2 DAYS, DEPENDING ON CLINICAL CONDITION.  Note: This dictation was prepared with Dragon dictation along with smaller phrase technology. Any transcriptional errors that result from this process are unintentional.   Nicholes Mango M.D on 12/12/2017 at 8:13 PM  Between 7am to 6pm - Pager - [phone] After 6pm go to [email or web address] - password [email or web address] [facility]  [facility]  Office  [phone]  CC: Primary care physician; Glendon Axe, MD

## 2017-12-12 NOTE — Care Management (Signed)
Speaking with [facility] to discuss care needs if returns home.  CM has left VM message for patient's son [name] to discuss unavailability of patient for home health visits for approximately two weeks.  Patient had telehealth with scales in the home but it was taken out around the first of February due to non compliance with daily weights, then gradually became less and less available for visits. [facility] states "there is a POA" that was often availalble for the home visits.  Will provide CM with that contact information. [facility] representative will come to unit to talk with patient about services and any concerns that is causing patient not to be available for visits.  If able to discharge home, will need to add SW.  There have not been concerns up to now that there is any need for APS referral per [facility]

## 2017-12-12 NOTE — Progress Notes (Signed)
PT Cancellation Note  Patient Details Name: James Collins MRN: [id number] DOB: [birth year]   Cancelled Treatment:    Reason Eval/Treat Not Completed: Other (comment). Evaluation attempted, however pt currently on bed pan having BM. Requesting to re-attempt at a different time. Of note, pt still with SOB symptoms and edema. Per RN, pt will be able to tolerate formalized PT eval once edema is improved.  Will attempt tomorrow.    Elisabella Hacker 12/12/2017, 10:43 AM  Greggory Stallion, PT, DPT [phone]

## 2017-12-12 NOTE — Plan of Care (Signed)
  Progressing Safety: Ability to remain free from injury will improve 12/12/2017 0128 - Progressing by Loran Senters, RN Skin Integrity: Risk for impaired skin integrity will decrease 12/12/2017 0128 - Progressing by Loran Senters, RN

## 2017-12-13 LAB — BASIC METABOLIC PANEL
Anion gap: 12 (ref 5–15)
BUN: 58 mg/dL — AB (ref 6–20)
CALCIUM: 8.4 mg/dL — AB (ref 8.9–10.3)
CO2: 28 mmol/L (ref 22–32)
Chloride: 101 mmol/L (ref 101–111)
Creatinine, Ser: 2.85 mg/dL — ABNORMAL HIGH (ref 0.61–1.24)
GFR calc Af Amer: 23 mL/min — ABNORMAL LOW (ref >60)
GFR, EST NON AFRICAN AMERICAN: 19 mL/min — AB (ref >60)
GLUCOSE: 139 mg/dL — AB (ref 65–99)
Potassium: 5 mmol/L (ref 3.5–5.1)
SODIUM: 141 mmol/L (ref 135–145)

## 2017-12-13 LAB — GLUCOSE, CAPILLARY
GLUCOSE-CAPILLARY: 171 mg/dL — AB (ref 65–99)
GLUCOSE-CAPILLARY: 223 mg/dL — AB (ref 65–99)
Glucose-Capillary: 124 mg/dL — ABNORMAL HIGH (ref 65–99)
Glucose-Capillary: 182 mg/dL — ABNORMAL HIGH (ref 65–99)

## 2017-12-13 MED ORDER — PREMIER PROTEIN SHAKE
11.0000 [oz_av] | Freq: Two times a day (BID) | ORAL | Status: DC
  Administered 2017-12-13 – 2017-12-19 (×12): 11 [oz_av] via ORAL

## 2017-12-13 MED ORDER — PREDNISONE 50 MG PO TABS
50.0000 mg | ORAL_TABLET | Freq: Every day | ORAL | Status: DC
  Administered 2017-12-14: 50 mg via ORAL
  Filled 2017-12-13: qty 1

## 2017-12-13 NOTE — Care Management Important Message (Signed)
Important Message  Patient Details  Name: James Collins MRN: [id number] Date of Birth: 10/07/1934   Medicare Important Message Given:  Yes    Beverly Sessions, RN 12/13/2017, 11:37 AM

## 2017-12-13 NOTE — Clinical Social Work Note (Signed)
Clinical Social Work Assessment  Patient Details  Name: James Collins MRN: [id number] Date of Birth: 1935-07-01  Date of referral:  12/13/17               Reason for consult:  Discharge Planning                Permission sought to share information with:  Family Supports Permission granted to share information::  Yes, Verbal Permission Granted  Name::        Agency::     Relationship::     Contact Information:     Housing/Transportation Living arrangements for the past 2 months:  Single Family Home Source of Information:  Adult Children Patient Interpreter Needed:  None Criminal Activity/Legal Involvement Pertinent to Current Situation/Hospitalization:  No - Comment as needed Significant Relationships:  Adult Children Lives with:  Adult Children Do you feel safe going back to the place where you live?  Yes Need for family participation in patient care:  Yes (Comment)  Care giving concerns:  Patient resides with his son: James Collins: [phone].    Social Worker assessment / plan:  CSW informed that PT is recommending STR. Patient has informed staff that he is wanting his son, James Collins to make the decision about discharge disposition. CSW contacted James Collins and he stated that he is in agreement with STR because when at home his dad does not get as motivated with just home health. He stated he was going to speak with his dad tonight to see for sure what they are going to do, but he gave permission to begin bed search. Bed search initiated. Will need to follow up with patient and son regarding bed offers.   Employment status:  Retired Forensic scientist:  Medicare PT Recommendations:  [place] / Referral to community resources:     Patient/Family's Response to care:  Patient's son expressed appreciation for CSW assistance.  Patient/Family's Understanding of and Emotional Response to Diagnosis, Current Treatment, and Prognosis:  Patient's son is patient's  primary caregiver and is involved in his treatment plan.  Emotional Assessment Appearance:  Appears stated age Attitude/Demeanor/Rapport:    Affect (typically observed):    Orientation:  Oriented to Self, Oriented to Place, Oriented to  Time, Oriented to Situation Alcohol / Substance use:  Not Applicable Psych involvement (Current and /or in the community):  No (Comment)  Discharge Needs  Concerns to be addressed:  Care Coordination Readmission within the last 30 days:  No Current discharge risk:  None Barriers to Discharge:  No Barriers Identified   Shela Leff, LCSW 12/13/2017, 4:20 PM

## 2017-12-13 NOTE — NC FL2 (Signed)
[place] LEVEL OF CARE SCREENING TOOL     IDENTIFICATION  Patient Name: James Collins Birthdate: [birth year] Sex: male Admission Date (Current Location): 12/11/2017  [place] and [place] Number:  Engineering geologist and Address:  [facility], [address] [place], [place] [address]      Provider Number: [id number]  Attending Physician Name and Address:  Nicholes Mango, MD  Relative Name and Phone Number:       Current Level of Care: Hospital Recommended Level of Care: [place] Prior Approval Number:    Date Approved/Denied:   PASRR Number: [id number]  Discharge Plan: SNF    Current Diagnoses: Patient Active Problem List   Diagnosis Date Noted  . CHF (congestive heart failure) ([place]) 12/11/2017  . Chronic diastolic heart failure ([place]) 07/29/2017  . HTN (hypertension) 07/29/2017  . Lymphedema 07/29/2017  . Cellulitis 07/09/2017  . Pressure injury of skin 07/09/2017  . 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    Orientation RESPIRATION BLADDER Height & Weight     Self, Place, Situation  O2(3 liters) Incontinent Weight: (!) 302 lb (137 kg) Height:  5\' 7"  (170.2 cm)  BEHAVIORAL SYMPTOMS/MOOD NEUROLOGICAL BOWEL NUTRITION STATUS  (none) (none) Incontinent Diet(carb modified)  AMBULATORY STATUS COMMUNICATION OF NEEDS Skin   Extensive Assist Verbally Normal                       Personal Care Assistance Level of Assistance  Bathing, Feeding, Dressing Bathing Assistance: Maximum assistance Feeding assistance: Maximum assistance Dressing Assistance: Maximum assistance     Functional Limitations Info  Hearing   Hearing Info: Impaired      SPECIAL CARE FACTORS FREQUENCY  PT (By licensed PT)                    Contractures Contractures Info: Not present    Additional Factors Info  Code Status, Allergies Code Status Info: full Allergies Info: pcn's           Current Medications (12/13/2017):  This is  the current hospital active medication list Current Facility-Administered Medications  Medication Dose Route Frequency Provider Last Rate Last Dose  . acetaminophen (TYLENOL) tablet 650 mg  650 mg Oral Q6H PRN Hillary Bow, MD       Or  . acetaminophen (TYLENOL) suppository 650 mg  650 mg Rectal Q6H PRN Sudini, Srikar, MD      . albuterol (PROVENTIL) (2.5 MG/3ML) 0.083% nebulizer solution 2.5 mg  2.5 mg Nebulization Q2H PRN Sudini, Srikar, MD      . amLODipine (NORVASC) tablet 5 mg  5 mg Oral Daily Hillary Bow, MD   5 mg at 12/13/17 0911  . aspirin EC tablet 81 mg  81 mg Oral Daily Hillary Bow, MD   81 mg at 12/13/17 0911  . calcium carbonate (TUMS - dosed in mg elemental calcium) chewable tablet 200 mg of elemental calcium  1 tablet Oral TID PRN Hillary Bow, MD      . chlorhexidine (PERIDEX) 0.12 % solution 15 mL  15 mL Mouth Rinse BID Hillary Bow, MD   15 mL at 12/13/17 0910  . finasteride (PROSCAR) tablet 5 mg  5 mg Oral Daily Hillary Bow, MD   5 mg at 12/13/17 0911  . guaiFENesin-codeine 100-10 MG/5ML solution 5 mL  5 mL Oral Q4H PRN Hillary Bow, MD   5 mL at 12/12/17 0826  . heparin injection 5,000 Units  5,000 Units Subcutaneous Q8H Hillary Bow, MD  5,000 Units at 12/13/17 1308  . hydrALAZINE (APRESOLINE) injection 10 mg  10 mg Intravenous Q6H PRN Sudini, Alveta Heimlich, MD      . insulin aspart (novoLOG) injection 0-15 Units  0-15 Units Subcutaneous TID WC Hillary Bow, MD   3 Units at 12/13/17 1215  . insulin aspart (novoLOG) injection 0-5 Units  0-5 Units Subcutaneous QHS Hillary Bow, MD   3 Units at 12/12/17 2100  . ipratropium-albuterol (DUONEB) 0.5-2.5 (3) MG/3ML nebulizer solution 3 mL  3 mL Nebulization Q6H Sudini, Srikar, MD   3 mL at 12/13/17 1433  . MEDLINE mouth rinse  15 mL Mouth Rinse q12n4p Hillary Bow, MD   15 mL at 12/12/17 1658  . mirtazapine (REMERON) tablet 15 mg  15 mg Oral QHS Hillary Bow, MD   15 mg at 12/12/17 2227  . ondansetron (ZOFRAN)  tablet 4 mg  4 mg Oral Q6H PRN Hillary Bow, MD       Or  . ondansetron (ZOFRAN) injection 4 mg  4 mg Intravenous Q6H PRN Sudini, Srikar, MD      . pantoprazole (PROTONIX) EC tablet 40 mg  40 mg Oral Daily Hillary Bow, MD   40 mg at 12/13/17 0911  . polyethylene glycol (MIRALAX / GLYCOLAX) packet 17 g  17 g Oral Daily PRN Hillary Bow, MD   17 g at 12/12/17 0826  . [START ON 12/14/2017] predniSONE (DELTASONE) tablet 50 mg  50 mg Oral Q breakfast Gouru, Aruna, MD      . protein supplement (PREMIER PROTEIN) liquid  11 oz Oral BID BM Gouru, Aruna, MD   11 oz at 12/13/17 1439  . sodium chloride flush (NS) 0.9 % injection 3 mL  3 mL Intravenous Q12H Hillary Bow, MD   3 mL at 12/13/17 0911  . sodium chloride flush (NS) 0.9 % injection 3 mL  3 mL Intravenous PRN Sudini, Alveta Heimlich, MD      . tamsulosin (FLOMAX) capsule 0.4 mg  0.4 mg Oral QHS Hillary Bow, MD   0.4 mg at 12/12/17 2227     Discharge Medications: Please see discharge summary for a list of discharge medications.  Relevant Imaging Results:  Relevant Lab Results:   Additional Information ss: [id number]  Shela Leff, LCSW

## 2017-12-13 NOTE — Evaluation (Signed)
Physical Therapy Evaluation Patient Details Name: SELSO MANNOR MRN: [id number] DOB: [birth year] Today's Date: 12/13/2017   History of Present Illness  83 yo male with onset of CHF and cardiomegaly noted, has acute COPD flare, AKI with CKD 3.  PMHx:  DM, groin edema, CHF, COPD, LE edema, CKD, HTN, lymphedema  Clinical Impression  Pt is up to side of bed with a great deal of assistance, incontinent of urine and not able to demonstrate good mobility independence.  He is recommended to SNF due to his dependence and weakness, but is hoping to return home when appropriate.  Follow acutely for standing and strengthening to progress as able and get his SNF stay shortened.    Follow Up Recommendations SNF;Supervision/Assistance - 24 hour    Equipment Recommendations  Rolling walker with 5" wheels(if he does not have a good walker)    Recommendations for Other Services       Precautions / Restrictions Precautions Precautions: Fall(telemetry) Precaution Comments: ck O2 sats with all mobility, on 3L O2 Restrictions Weight Bearing Restrictions: No      Mobility  Bed Mobility Overal bed mobility: Needs Assistance Bed Mobility: Supine to Sit;Sit to Supine;Rolling Rolling: Mod assist;+2 for physical assistance;+2 for safety/equipment   Supine to sit: Max assist;+2 for physical assistance;+2 for safety/equipment;HOB elevated Sit to supine: Max assist;+2 for physical assistance;+2 for safety/equipment   General bed mobility comments: flat HOB to return to bed, [place] for scooting up  Transfers                 General transfer comment: pt was not able to stand as he is fatigued and weak from bedside sitting  Ambulation/Gait             General Gait Details: unable  Stairs            Wheelchair Mobility    Modified Rankin (Stroke Patients Only)       Balance Overall balance assessment: Needs assistance Sitting-balance support: Bilateral upper extremity  supported;Feet supported Sitting balance-Leahy Scale: Poor                                       Pertinent Vitals/Pain Pain Assessment: Faces Faces Pain Scale: Hurts little more Pain Location: legs due to edema Pain Descriptors / Indicators: Aching Pain Intervention(s): Monitored during session;Repositioned    Home Living Family/patient expects to be discharged to:: Private residence Living Arrangements: Children;Other relatives Available Help at Discharge: Family Type of Home: House Home Access: Ramped entrance     Home Layout: One level Home Equipment: [place] - 2 wheels;Wheelchair - manual;Tub bench Additional Comments: has extended family with one person there a good bit of the time    Prior Function Level of Independence: Needs assistance   Gait / Transfers Assistance Needed: RW for short trips around his recliner  ADL's / Homemaking Assistance Needed: Assist for meals.  Sits on tub bench for bathing in shower (mod I).  Comments: Pt uses manual w/c mostly but uses walker for ambulating in/out of bathroom (about 25 feet distance at a time).  Pt has [employer] in home.     Hand Dominance   Dominant Hand: Right    Extremity/Trunk Assessment   Upper Extremity Assessment Upper Extremity Assessment: Generalized weakness    Lower Extremity Assessment Lower Extremity Assessment: Generalized weakness    Cervical / Trunk Assessment Cervical / Trunk Assessment: Normal  Communication   Communication: No difficulties  Cognition Arousal/Alertness: Awake/alert Behavior During Therapy: Anxious Overall Cognitive Status: Within Functional Limits for tasks assessed                                 General Comments: Pt is able to understand what PT is asking of him but may be unrealistic about how he can manage at home      General Comments General comments (skin integrity, edema, etc.): has open areas and edema on BLE's with groin edema as  well    Exercises     Assessment/Plan    PT Assessment Patient needs continued PT services  PT Problem List Decreased strength;Decreased range of motion;Decreased activity tolerance;Decreased balance;Decreased mobility;Decreased coordination;Decreased knowledge of use of DME;Decreased safety awareness;Cardiopulmonary status limiting activity;Obesity;Decreased skin integrity;Pain       PT Treatment Interventions DME instruction;Gait training;Functional mobility training;Therapeutic activities;Therapeutic exercise;Balance training;Neuromuscular re-education;Patient/family education    PT Goals (Current goals can be found in the Care Plan section)  Acute Rehab PT Goals Patient Stated Goal: to get stronger PT Goal Formulation: With patient Time For Goal Achievement: 12/27/17 Potential to Achieve Goals: Good    Frequency Min 2X/week   Barriers to discharge Inaccessible home environment;Decreased caregiver support home with one person at times    Co-evaluation               AM-PAC PT "6 Clicks" Daily Activity  Outcome Measure Difficulty turning over in bed (including adjusting bedclothes, sheets and blankets)?: Unable Difficulty moving from lying on back to sitting on the side of the bed? : Unable Difficulty sitting down on and standing up from a chair with arms (e.g., wheelchair, bedside commode, etc,.)?: Unable Help needed moving to and from a bed to chair (including a wheelchair)?: Total Help needed walking in hospital room?: Total Help needed climbing 3-5 steps with a railing? : Total 6 Click Score: 6    End of Session Equipment Utilized During Treatment: Oxygen Activity Tolerance: Patient tolerated treatment well;Patient limited by fatigue;Treatment limited secondary to medical complications (Comment)(O2 sats dropped but remained in 90's with effort) Patient left: in bed;with call bell/phone within reach;with bed alarm set;with nursing/sitter in room Nurse  Communication: Mobility status PT Visit Diagnosis: Unsteadiness on feet (R26.81);Muscle weakness (generalized) (M62.81);Difficulty in walking, not elsewhere classified (R26.2);Adult, failure to thrive (R62.7);Other abnormalities of gait and mobility (R26.89)    Time: 8250-5397 PT Time Calculation (min) (ACUTE ONLY): 29 min   Charges:   PT Evaluation $PT Eval Moderate Complexity: 1 Mod PT Treatments $Therapeutic Activity: 8-22 mins   PT G Codes:   PT G-Codes **NOT FOR INPATIENT CLASS** Functional Assessment Tool Used: AM-PAC 6 Clicks Basic Mobility    Ramond Dial 12/13/2017, 11:18 AM   Mee Hives, PT MS Acute Rehab Dept. Number: [place] and [place]

## 2017-12-13 NOTE — Progress Notes (Addendum)
[place] [facility] [place] NAME: James Collins    MR#:  [id number]  DATE OF BIRTH:  November 14, 1934  SUBJECTIVE:  CHIEF COMPLAINT:  pts sob is better, still feels weak and tired ch lives on 2 lit o2 and now on 3 lit.  Patient sees Dr. Clayborn Bigness as an outpatient  REVIEW OF SYSTEMS:  CONSTITUTIONAL: No fever, fatigue or weakness.  EYES: No blurred or double vision.  EARS, NOSE, AND THROAT: No tinnitus or ear pain.  RESPIRATORY: No cough, reports improving shortness of breath, denies wheezing or hemoptysis.  CARDIOVASCULAR: No chest pain, orthopnea, edema.  GASTROINTESTINAL: No nausea, vomiting, diarrhea or abdominal pain.  GENITOURINARY: No dysuria, hematuria.  ENDOCRINE: No polyuria, nocturia,  HEMATOLOGY: No anemia, easy bruising or bleeding SKIN: No rash or lesion. MUSCULOSKELETAL: No joint pain or arthritis.   NEUROLOGIC: No tingling, numbness, weakness.  PSYCHIATRY: No anxiety or depression.   DRUG ALLERGIES:   Allergies  Allergen Reactions  . Penicillins     VITALS:  Blood pressure 139/62, pulse 77, temperature 98.2 F (36.8 C), resp. rate 18, height 5\' 7"  (1.702 m), weight (!) 137 kg (302 lb), SpO2 94 %.  PHYSICAL EXAMINATION:  GENERAL:  83 y.o.-year-old patient lying in the bed with no acute distress.  EYES: Pupils equal, round, reactive to light and accommodation. No scleral icterus. Extraocular muscles intact.  HEENT: Head atraumatic, normocephalic. Oropharynx and nasopharynx clear.  NECK:  Supple, no jugular venous distention. No thyroid enlargement, no tenderness.  LUNGS:  moderate breath sounds bilaterally, no wheezing, rales,rhonchi or crepitation. No use of accessory muscles of respiration.  CARDIOVASCULAR: S1, S2 normal. No murmurs, rubs, or gallops.  ABDOMEN: Soft, nontender, nondistended. Bowel sounds present. No organomegaly or mass.  EXTREMITIES: Chronic venous changes on bilateral lower extremities, no cyanosis, or  clubbing.  NEUROLOGIC: Cranial nerves II through XII are intact. Muscle strength generalized weakness in all extremities. Sensation intact. Gait not checked.  PSYCHIATRIC: The patient is alert and oriented x 3.  SKIN: No obvious rash, lesion, or ulcer.    LABORATORY PANEL:   CBC Recent Labs  Lab 12/12/17 0529  WBC 6.3  HGB 8.4*  HCT 26.3*  PLT 117*   ------------------------------------------------------------------------------------------------------------------  Chemistries  Recent Labs  Lab 12/13/17 0442  NA 141  K 5.0  CL 101  CO2 28  GLUCOSE 139*  BUN 58*  CREATININE 2.85*  CALCIUM 8.4*   ------------------------------------------------------------------------------------------------------------------  Cardiac Enzymes Recent Labs  Lab 12/11/17 0631  TROPONINI <0.03   ------------------------------------------------------------------------------------------------------------------  RADIOLOGY:  No results found.  EKG:   Orders placed or performed during the hospital encounter of 12/11/17  . ED EKG  . ED EKG    ASSESSMENT AND PLAN:    * Acute on chronic diastolic congestive heart failure -Clinically improving - IV Lasix 60 mg twice daily was started during this admission, patient is clinically improving and renal function is getting worse, hold Lasix and potassium supplements today - In take and Output and daily weight monitoring - Counseled to limit fluids and Salt - Monitor Bun/Cr and Potassium - Echo -from October 2018 ejection fraction 65-70%, normal overall wall motion, normal study - Cardiology follow up after discharge  *Acute COPD exacerbation -IV steroids  tapered as patient is clinically improving - Scheduled Nebulizers - Inhalers -Wean O2 as tolerated - Consult pulmonary if no improvement Influenza negative  *Diabetes mellitus.  Sliding scale insulin.  *Acute kidney injury over CKD stage III.  With hyperkalemia  Hold Lasix  and potassium supplements Creatinine baseline seems to be at 1.8, during this admission  2.38-2.46 -2.85 repeat labs in the morning.  Monitor input and output. Consider nephrology consult if no improvement and if the renal function is getting worse still  *UA is abnormal but urine culture is contaminated patient is asymptomatic  *DVT prophylaxis with heparin subcutaneous    Physical therapy is recommending skilled nursing facility, with 24 hours supervision and 5 inch rolling walker at the time of discharge  All the records are reviewed and case discussed with Care Management/Social Workerr. Management plans discussed with the patient, family and they are in agreement.  CODE STATUS: fc  TOTAL TIME TAKING CARE OF THIS PATIENT: 35 minutes.   POSSIBLE D/C IN 1-2 DAYS, DEPENDING ON CLINICAL CONDITION.  Note: This dictation was prepared with Dragon dictation along with smaller phrase technology. Any transcriptional errors that result from this process are unintentional.   Nicholes Mango M.D on 12/13/2017 at 2:42 PM  Between 7am to 6pm - Pager - [phone] After 6pm go to [email or web address] - password [email or web address]  [place] [facility]  Office  [phone]  CC: Primary care physician; Glendon Axe, MD

## 2017-12-13 NOTE — Care Management (Signed)
[employer] rep spoke with patient today in regards to home health.  Per Brittney from [facility] she was unable to elicit a lot of information from patient.  Patient states 'he isn't sure why [name] is answering the phone for them".  PT has assessed patient and recommends SNF. Per CSW son [name] is to meet with patient tonight to see if he will be agreeable to SNF.  Bed search has been initiated.

## 2017-12-13 NOTE — Plan of Care (Signed)
  Clinical Measurements: Ability to maintain clinical measurements within normal limits will improve 12/13/2017 1521 - Not Progressing by Daron Offer, RN Note Patient's creatinine level is elevated today at 2.9. Will continue to monitor kidney function. Wenda Low [facility]

## 2017-12-13 NOTE — Progress Notes (Signed)
Nutrition Education Note  RD consulted for nutrition education regarding new onset CHF.  83 y/o male admitted with CHF  Pt is known to nutrition department for previous educations. Pt upset at time of RD visit today because he feels that he is unable to get in touch with his RN and he is needing to use the restroom. RD re-educated pt today regarding a low sodium diet. Pt reports "I do not eat salt". Per chart, pt appears to be weight stable.   RD provided "Low Sodium Nutrition Therapy" handout from the Academy of [employer]etics. Reviewed patient's dietary recall. Provided examples on ways to decrease sodium intake in diet. Discouraged intake of processed foods and use of salt shaker. Encouraged fresh fruits and vegetables as well as whole grain sources of carbohydrates to maximize fiber intake.   RD discussed why it is important for patient to adhere to diet recommendations, and emphasized the role of fluids, foods to avoid, and importance of weighing self daily. Teach back method used.  Expect fair compliance.  Body mass index is 47.3 kg/m. Pt meets criteria for morbid obesity based on current BMI.  Current diet order is CHO modified diet, patient is consuming approximately 100% of meals at this time.   Labs and medications reviewed. No further nutrition interventions warranted at this time. RD contact information provided. If additional nutrition issues arise, please re-consult RD.   Koleen Distance MS, RD, LDN Pager #- [phone] After Hours Pager: [phone]

## 2017-12-14 ENCOUNTER — Inpatient Hospital Stay: Payer: Medicare Other

## 2017-12-14 LAB — GLUCOSE, CAPILLARY
GLUCOSE-CAPILLARY: 173 mg/dL — AB (ref 65–99)
GLUCOSE-CAPILLARY: 215 mg/dL — AB (ref 65–99)
GLUCOSE-CAPILLARY: 210 mg/dL — AB (ref 65–99)
Glucose-Capillary: 130 mg/dL — ABNORMAL HIGH (ref 65–99)

## 2017-12-14 LAB — URINALYSIS, ROUTINE W REFLEX MICROSCOPIC
Bilirubin Urine: NEGATIVE
GLUCOSE, UA: NEGATIVE mg/dL
HGB URINE DIPSTICK: NEGATIVE
Ketones, ur: NEGATIVE mg/dL
LEUKOCYTES UA: NEGATIVE
NITRITE: NEGATIVE
PH: 8 (ref 5.0–8.0)
PROTEIN: 100 mg/dL — AB
Specific Gravity, Urine: 1.015 (ref 1.005–1.030)

## 2017-12-14 LAB — PROTEIN / CREATININE RATIO, URINE
Creatinine, Urine: 83 mg/dL
Protein Creatinine Ratio: 1.27 mg/mg{Cre} — ABNORMAL HIGH (ref 0.00–0.15)
Total Protein, Urine: 105 mg/dL

## 2017-12-14 LAB — BASIC METABOLIC PANEL
Anion gap: 9 (ref 5–15)
BUN: 65 mg/dL — ABNORMAL HIGH (ref 6–20)
CALCIUM: 8.5 mg/dL — AB (ref 8.9–10.3)
CHLORIDE: 105 mmol/L (ref 101–111)
CO2: 29 mmol/L (ref 22–32)
CREATININE: 2.69 mg/dL — AB (ref 0.61–1.24)
GFR calc Af Amer: 24 mL/min — ABNORMAL LOW (ref >60)
GFR calc non Af Amer: 21 mL/min — ABNORMAL LOW (ref >60)
GLUCOSE: 138 mg/dL — AB (ref 65–99)
Potassium: 5.2 mmol/L — ABNORMAL HIGH (ref 3.5–5.1)
Sodium: 143 mmol/L (ref 135–145)

## 2017-12-14 LAB — FERRITIN: FERRITIN: 86 ng/mL (ref 24–336)

## 2017-12-14 LAB — PHOSPHORUS: Phosphorus: 4.1 mg/dL (ref 2.5–4.6)

## 2017-12-14 LAB — IRON AND TIBC
Iron: 66 ug/dL (ref 45–182)
Saturation Ratios: 25 % (ref 17.9–39.5)
TIBC: 265 ug/dL (ref 250–450)
UIBC: 199 ug/dL

## 2017-12-14 MED ORDER — SODIUM POLYSTYRENE SULFONATE PO POWD
30.0000 g | Freq: Once | ORAL | Status: DC
  Filled 2017-12-14: qty 30

## 2017-12-14 MED ORDER — SODIUM POLYSTYRENE SULFONATE 15 GM/60ML PO SUSP
30.0000 g | Freq: Once | ORAL | Status: AC
Start: 2017-12-14 — End: 2017-12-14
  Administered 2017-12-14: 30 g via ORAL
  Filled 2017-12-14: qty 120

## 2017-12-14 MED ORDER — PREDNISONE 20 MG PO TABS
20.0000 mg | ORAL_TABLET | Freq: Every day | ORAL | Status: AC
  Administered 2017-12-15 – 2017-12-17 (×3): 20 mg via ORAL
  Filled 2017-12-14 (×3): qty 1

## 2017-12-14 NOTE — Progress Notes (Signed)
Pt. Has been awake all night going to sleep around 5. No c/o pain. Pt ran SR with HR in 70's.

## 2017-12-14 NOTE — Plan of Care (Signed)
Pt is A&Ox4. VSS . RA . NSR on monitor. Pt is extremely hard of hearing. 2L O2 [place] continued. OOB to chair with lift. No complaints thus far. Will continue to monitor and report to oncoming RN .  Progressing Education: Ability to demonstrate management of disease process will improve 12/14/2017 1328 - Progressing by Aleen Campi, RN Ability to verbalize understanding of medication therapies will improve 12/14/2017 1328 - Progressing by Aleen Campi, RN Activity: Capacity to carry out activities will improve 12/14/2017 1328 - Progressing by Aleen Campi, RN Cardiac: Ability to achieve and maintain adequate cardiopulmonary perfusion will improve 12/14/2017 1328 - Progressing by Aleen Campi, RN Education: Knowledge of General Education information will improve 12/14/2017 1328 - Progressing by Aleen Campi, RN Health Behavior/Discharge Planning: Ability to manage health-related needs will improve 12/14/2017 1328 - Progressing by Aleen Campi, RN Clinical Measurements: Ability to maintain clinical measurements within normal limits will improve 12/14/2017 1328 - Progressing by Aleen Campi, RN Will remain free from infection 12/14/2017 1328 - Progressing by Aleen Campi, RN Diagnostic test results will improve 12/14/2017 1328 - Progressing by Aleen Campi, RN Respiratory complications will improve 12/14/2017 1328 - Progressing by Aleen Campi, RN Cardiovascular complication will be avoided 12/14/2017 1328 - Progressing by Aleen Campi, RN Activity: Risk for activity intolerance will decrease 12/14/2017 1328 - Progressing by Aleen Campi, RN Nutrition: Adequate nutrition will be maintained 12/14/2017 1328 - Progressing by Aleen Campi, RN Safety: Ability to remain free from injury will improve 12/14/2017 1328 - Progressing by Aleen Campi, RN Skin Integrity: Risk for impaired skin integrity will decrease 12/14/2017 1328 - Progressing by Aleen Campi, RN

## 2017-12-14 NOTE — Consult Note (Signed)
[facility]  CONSULT NOTE    Date: 12/14/2017                  Patient Name:  James Collins  MRN: [id number]  DOB: May 19, 1935  Age / Sex: 83 y.o., male         PCP: Glendon Axe, MD                 Service Requesting Consult: Dr. Margaretmary Eddy                 Reason for Consult: Acute renal failure on chronic kidney disease stage III            History of Present Illness: James Collins is a 83 y.o. white male with congestive heart failure, lymphedema, COPD, hypertension, diabetes mellitus type II, morbid obesity, who was admitted to [facility] on 12/11/2017 for Acute kidney injury superimposed on chronic kidney disease ([place]) [N17.9, N18.9] Acute on chronic congestive heart failure, unspecified heart failure type ([place]) [I50.9] Acute on chronic anemia [D64.9]  Patient was given high dose furosemide on admission for acute exacerbation of CHF. Creatinine 2.46-> 2.85->2.69. Nephrology consulted.   Patient must be spoken to loudly. Not a good historian. Complains of shortness of breath, peripheral edema and pain with urination.    Medications: Outpatient medications: Medications Prior to Admission  Medication Sig Dispense Refill Last Dose  . amLODipine (NORVASC) 5 MG tablet Take 5 mg by mouth daily.    12/10/2017 at AM  . aspirin EC 81 MG tablet Take 81 mg by mouth daily.   12/10/2017 at AM  . benazepril (LOTENSIN) 20 MG tablet Take 20 mg by mouth daily.    12/10/2017 at AM  . calcium carbonate (TUMS - DOSED IN MG ELEMENTAL CALCIUM) 500 MG chewable tablet Chew 1 tablet by mouth 3 (three) times daily as needed for indigestion or heartburn.   PRN at PRN  . finasteride (PROSCAR) 5 MG tablet Take 5 mg by mouth daily.    12/10/2017 at N/A  . furosemide (LASIX) 20 MG tablet Take 1 tablet (20 mg total) by mouth daily. 30 tablet 0 12/10/2017 at AM  . loratadine (CLARITIN) 10 MG tablet Take 1 tablet by mouth daily as needed for allergies.    PRN at PRN  . omeprazole  (PRILOSEC) 40 MG capsule Take 40 mg by mouth daily.   12/10/2017 at AM  . potassium chloride SA (K-DUR,KLOR-CON) 20 MEQ tablet Take 1 tablet (20 mEq total) by mouth daily. 30 tablet 0 12/10/2017 at AM  . tamsulosin (FLOMAX) 0.4 MG CAPS capsule Take 0.4 mg by mouth at bedtime.   12/10/2017 at N/A  . vitamin B-12 (CYANOCOBALAMIN) 1000 MCG tablet Take 1,000 mcg by mouth daily.   12/10/2017 at N/A    Current medications: Current Facility-Administered Medications  Medication Dose Route Frequency Provider Last Rate Last Dose  . acetaminophen (TYLENOL) tablet 650 mg  650 mg Oral Q6H PRN Hillary Bow, MD       Or  . acetaminophen (TYLENOL) suppository 650 mg  650 mg Rectal Q6H PRN Sudini, Srikar, MD      . albuterol (PROVENTIL) (2.5 MG/3ML) 0.083% nebulizer solution 2.5 mg  2.5 mg Nebulization Q2H PRN Sudini, Srikar, MD      . amLODipine (NORVASC) tablet 5 mg  5 mg Oral Daily Hillary Bow, MD   5 mg at 12/14/17 0802  . aspirin EC tablet 81 mg  81 mg Oral Daily Sudini, Artist,  MD   81 mg at 12/14/17 0801  . calcium carbonate (TUMS - dosed in mg elemental calcium) chewable tablet 200 mg of elemental calcium  1 tablet Oral TID PRN Hillary Bow, MD      . chlorhexidine (PERIDEX) 0.12 % solution 15 mL  15 mL Mouth Rinse BID Hillary Bow, MD   15 mL at 12/14/17 0800  . finasteride (PROSCAR) tablet 5 mg  5 mg Oral Daily Hillary Bow, MD   5 mg at 12/14/17 0801  . guaiFENesin-codeine 100-10 MG/5ML solution 5 mL  5 mL Oral Q4H PRN Hillary Bow, MD   5 mL at 12/12/17 0826  . heparin injection 5,000 Units  5,000 Units Subcutaneous Q8H Sudini, Alveta Heimlich, MD   5,000 Units at 12/14/17 0446  . hydrALAZINE (APRESOLINE) injection 10 mg  10 mg Intravenous Q6H PRN Sudini, Alveta Heimlich, MD      . insulin aspart (novoLOG) injection 0-15 Units  0-15 Units Subcutaneous TID WC Hillary Bow, MD   2 Units at 12/14/17 0801  . insulin aspart (novoLOG) injection 0-5 Units  0-5 Units Subcutaneous QHS Hillary Bow, MD   3 Units  at 12/12/17 2100  . ipratropium-albuterol (DUONEB) 0.5-2.5 (3) MG/3ML nebulizer solution 3 mL  3 mL Nebulization Q6H Sudini, Srikar, MD   3 mL at 12/14/17 0739  . MEDLINE mouth rinse  15 mL Mouth Rinse q12n4p Hillary Bow, MD   15 mL at 12/12/17 1658  . mirtazapine (REMERON) tablet 15 mg  15 mg Oral QHS Hillary Bow, MD   15 mg at 12/13/17 2121  . ondansetron (ZOFRAN) tablet 4 mg  4 mg Oral Q6H PRN Hillary Bow, MD       Or  . ondansetron (ZOFRAN) injection 4 mg  4 mg Intravenous Q6H PRN Sudini, Srikar, MD      . pantoprazole (PROTONIX) EC tablet 40 mg  40 mg Oral Daily Hillary Bow, MD   40 mg at 12/14/17 0801  . polyethylene glycol (MIRALAX / GLYCOLAX) packet 17 g  17 g Oral Daily PRN Hillary Bow, MD   17 g at 12/12/17 0826  . predniSONE (DELTASONE) tablet 50 mg  50 mg Oral Q breakfast Gouru, Aruna, MD   50 mg at 12/14/17 0801  . protein supplement (PREMIER PROTEIN) liquid  11 oz Oral BID BM Gouru, Aruna, MD   11 oz at 12/14/17 0802  . sodium chloride flush (NS) 0.9 % injection 3 mL  3 mL Intravenous Q12H Hillary Bow, MD   3 mL at 12/14/17 0802  . sodium chloride flush (NS) 0.9 % injection 3 mL  3 mL Intravenous PRN Sudini, Alveta Heimlich, MD      . tamsulosin (FLOMAX) capsule 0.4 mg  0.4 mg Oral QHS Hillary Bow, MD   0.4 mg at 12/13/17 2122      Allergies: Allergies  Allergen Reactions  . Penicillins       Past Medical History: Past Medical History:  Diagnosis Date  . CHF (congestive heart failure) ([place])   . Chronic kidney disease   . COPD (chronic obstructive pulmonary disease) ([place])   . Diabetes mellitus without complication ([place])   . Hypertension   . Lymphedema      Past Surgical History: Past Surgical History:  Procedure Laterality Date  . CATARACT EXTRACTION, BILATERAL Bilateral   . REPLACEMENT TOTAL KNEE Left      Family History: Family History  Problem Relation Age of Onset  . CAD Father      Social History: Social History  Socioeconomic  History  . Marital status: Widowed    Spouse name: Not on file  . Number of children: Not on file  . Years of education: Not on file  . Highest education level: Not on file  Social Needs  . Financial resource strain: Not on file  . Food insecurity - worry: Not on file  . Food insecurity - inability: Not on file  . Transportation needs - medical: Not on file  . Transportation needs - non-medical: Not on file  Occupational History  . Not on file  Tobacco Use  . Smoking status: Never Smoker  . Smokeless tobacco: Never Used  Substance and Sexual Activity  . Alcohol use: No  . Drug use: No  . Sexual activity: Not on file  Other Topics Concern  . Not on file  Social History Narrative  . Not on file     Review of Systems: Review of Systems  Unable to perform ROS: Language    Vital Signs: Blood pressure (!) 155/72, pulse 73, temperature 98.2 F (36.8 C), temperature source Oral, resp. rate 18, height 5\' 7"  (1.702 m), weight 133.4 kg (294 lb), SpO2 100 %.  Weight trends: Filed Weights   12/12/17 0200 12/13/17 0417 12/14/17 0316  Weight: (!) 146.3 kg (322 lb 8.5 oz) (!) 137 kg (302 lb) 133.4 kg (294 lb)    Physical Exam: General: Obese, seated inchair  Head: Normocephalic, atraumatic. Moist oral mucosal membranes  Eyes: Anicteric, PERRL  Neck: Supple, trachea midline  Lungs:  Diminished bilaterally  Heart: Regular rate and rhythm  Abdomen:  Soft, nontender, obese  Extremities:  3+ peripheral edema.  Neurologic: Nonfocal, moving all four extremities  Skin: No lesions         Lab results: Basic Metabolic Panel: Recent Labs  Lab 12/12/17 0529 12/13/17 0442 12/14/17 0444 12/14/17 0946  NA 142 141 143  --   K 4.8 5.0 5.2*  --   CL 106 101 105  --   CO2 27 28 29   --   GLUCOSE 171* 139* 138*  --   BUN 45* 58* 65*  --   CREATININE 2.46* 2.85* 2.69*  --   CALCIUM 8.4* 8.4* 8.5*  --   PHOS  --   --   --  4.1    Liver Function Tests: No results for input(s):  AST, ALT, ALKPHOS, BILITOT, PROT, ALBUMIN in the last 168 hours. No results for input(s): LIPASE, AMYLASE in the last 168 hours. No results for input(s): AMMONIA in the last 168 hours.  CBC: Recent Labs  Lab 12/11/17 0631 12/12/17 0529  WBC 6.4 6.3  NEUTROABS 3.5  --   HGB 9.9* 8.4*  HCT 30.8* 26.3*  MCV 87.1 86.3  PLT 128* 117*    Cardiac Enzymes: Recent Labs  Lab 12/11/17 0631  TROPONINI <0.03    BNP: Invalid input(s): POCBNP  CBG: Recent Labs  Lab 12/13/17 0742 12/13/17 1134 12/13/17 1630 12/13/17 2056 12/14/17 0744  GLUCAP 124* 171* 223* 182* 130*    Microbiology: Results for orders placed or performed during the hospital encounter of 08/13/17  Urine Culture     Status: Abnormal   Collection Time: 08/13/17  2:22 PM  Result Value Ref Range Status   Specimen Description URINE, RANDOM  Final   Special Requests NONE  Final   Culture MULTIPLE SPECIES PRESENT, SUGGEST RECOLLECTION (A)  Final   Report Status 08/15/2017 FINAL  Final    Coagulation Studies: No results for input(s): LABPROT, INR  in the last 72 hours.  Urinalysis: No results for input(s): COLORURINE, LABSPEC, PHURINE, GLUCOSEU, HGBUR, BILIRUBINUR, KETONESUR, PROTEINUR, UROBILINOGEN, NITRITE, LEUKOCYTESUR in the last 72 hours.  Invalid input(s): APPERANCEUR    Imaging:  No results found.   Assessment & Plan: James Collins is a 83 y.o. white male with congestive heart failure, lymphedema, COPD, hypertension, diabetes mellitus type II, morbid obesity, who was admitted to [facility] on 12/11/2017 for Acute kidney injury superimposed on chronic kidney disease ([place]) [N17.9, N18.9] Acute on chronic congestive heart failure, unspecified heart failure type ([place]) [I50.9] Acute on chronic anemia [D64.9]  1. Acute renal failure with hyperkalemia on chronic kidney disease stage III: baseline creatinine of 1.88, GFR of 32, on 10/25/17.  Chronic kidney disease secondary to hypertension and  diabetes Acute renal failure is acute cardiorenal syndrome versus overdiuresis from loop diuretics.  Hyperkalemia from potassium supplementation - Continue to hold diuretics. Home regimen of furosemide 20mg  daily. Most likely an inadequate dose.  - Hold potassium supplementation - CKD work up: check renal ultrasound, SPEP/UPEP, urine studies, hepatitis screen, HIV, PTH, phosphorus.  - Holding benazepril.   2. Hypertension: with acute exacerbation of congestive heart failure with preserved systolic function on echocardiogram from 07/09/2017 Home regimen of furosemide, benazepril, and amlodipine.  Chronic lymphedema - Continue amlodipine  3. Anemia with chronic kidney disease and thrombocytopenia: hemoglobin 8.4 normocytic.  - Check iron studies.   4. Acute exacerbation of COPD: on oxygen.  - Continue supportive care, O2, systemic steroids, nebs.    LOS: 3 [place] Korver 3/16/201910:51 AM

## 2017-12-14 NOTE — Progress Notes (Signed)
Chaplain followed up on OR to pray with the patient. Patient is beginning to process his death and sought an understanding about his relationship with the Divine. Through active listening, reflective questions, and ministerial presence, the Chaplain assisted the patient in meaning making. In this process, the patience spoke his own truth about the Divine, affirming his beliefs. The patient will probably raise these concerns again as the process continues.

## 2017-12-14 NOTE — Plan of Care (Signed)
  Activity: Capacity to carry out activities will improve 12/14/2017 2028 - Not Progressing by Marylouise Stacks, RN   Cardiac: Ability to achieve and maintain adequate cardiopulmonary perfusion will improve 12/14/2017 2028 - Not Progressing by Marylouise Stacks, RN   Clinical Measurements: Cardiovascular complication will be avoided 12/14/2017 2028 - Not Progressing by Marylouise Stacks, RN

## 2017-12-14 NOTE — Progress Notes (Signed)
[place] [facility] [place] NAME: Lionel Woodberry    MR#:  [id number]  DATE OF BIRTH:  [birth year]  SUBJECTIVE:   Hard of hearing  Shortness of breath is better.  Sitting in a chair.  Very weak.  On 3 L oxygen.  REVIEW OF SYSTEMS:  CONSTITUTIONAL: Fatigue EYES: No blurred or double vision.  EARS, NOSE, AND THROAT: No tinnitus or ear pain.  RESPIRATORY: Has cough and shortness of breath. CARDIOVASCULAR: No chest pain, orthopnea, edema.  GASTROINTESTINAL: No nausea, vomiting, diarrhea or abdominal pain.  GENITOURINARY: No dysuria, hematuria.  ENDOCRINE: No polyuria, nocturia,  HEMATOLOGY: No anemia, easy bruising or bleeding SKIN: No rash or lesion. MUSCULOSKELETAL: No joint pain or arthritis.   NEUROLOGIC: No tingling, numbness, weakness.  PSYCHIATRY: No anxiety or depression.   DRUG ALLERGIES:   Allergies  Allergen Reactions  . Penicillins     VITALS:  Blood pressure (!) 155/72, pulse 73, temperature 98.2 F (36.8 C), temperature source Oral, resp. rate 18, height 5\' 7"  (1.702 m), weight 133.4 kg (294 lb), SpO2 100 %.  PHYSICAL EXAMINATION:  GENERAL:  83 y.o.-year-old patient lying in the bed with no acute distress.  Obese EYES: Pupils equal, round, reactive to light and accommodation. No scleral icterus. Extraocular muscles intact.  HEENT: Head atraumatic, normocephalic. Oropharynx and nasopharynx clear.  NECK:  Supple, no jugular venous distention. No thyroid enlargement, no tenderness.  LUNGS: Bilateral wheezing and decreased air entry CARDIOVASCULAR: S1, S2 normal. No murmurs, rubs, or gallops.  ABDOMEN: Soft, nontender, nondistended. Bowel sounds present. No organomegaly or mass.  EXTREMITIES: Chronic venous changes on bilateral lower extremities, no cyanosis, or clubbing.  NEUROLOGIC: Cranial nerves II through XII are intact. Muscle strength generalized weakness in all extremities. Sensation intact. Gait not checked.   PSYCHIATRIC: The patient is alert and oriented x 3.  SKIN: Bilateral lower extremity thickened skin   LABORATORY PANEL:   CBC Recent Labs  Lab 12/12/17 0529  WBC 6.3  HGB 8.4*  HCT 26.3*  PLT 117*   ------------------------------------------------------------------------------------------------------------------  Chemistries  Recent Labs  Lab 12/14/17 0444  NA 143  K 5.2*  CL 105  CO2 29  GLUCOSE 138*  BUN 65*  CREATININE 2.69*  CALCIUM 8.5*   ------------------------------------------------------------------------------------------------------------------  Cardiac Enzymes Recent Labs  Lab 12/11/17 0631  TROPONINI <0.03   ------------------------------------------------------------------------------------------------------------------  RADIOLOGY:  No results found.  EKG:   Orders placed or performed during the hospital encounter of 12/11/17  . ED EKG  . ED EKG    ASSESSMENT AND PLAN:    * Acute on chronic diastolic congestive heart failure -Lasix held due to worsening renal function - In take and Output and daily weight monitoring - Monitor Bun/Cr and Potassium - Echo -from October 2018 ejection fraction 65-70%, normal overall wall motion, normal study - Cardiology follow up after discharge  *Acute COPD exacerbation -Decrease dose of prednisone today - Nebulizers, Inhalers -Wean O2 as tolerated Influenza negative  *Diabetes mellitus, uncontrolled with hyperglycemia due to steroids.  Sliding scale insulin.  Steroid dose decreased today.  *Acute kidney injury over CKD stage III.  With hyperkalemia   Hold Lasix and potassium supplements Creatinine baseline seems to be at 1.8, during this admission  2.38-2.46 -2.85- 2.69 Repeat labs in the morning.  Monitor input and output. Nephrology consulted.  Appreciate input.  *DVT prophylaxis with heparin subcutaneous   Discharge in 1-2 days to skilled nursing facility.  All the records are  reviewed and case  discussed with Care Management/Social Workerr. Management plans discussed with the patient, family and they are in agreement.  CODE STATUS: Partial code.  No intubation but okay to be resuscitated.  TOTAL TIME TAKING CARE OF THIS PATIENT: 35 minutes.   POSSIBLE D/C IN 1-2 DAYS, DEPENDING ON CLINICAL CONDITION.  Note: This dictation was prepared with Dragon dictation along with smaller phrase technology. Any transcriptional errors that result from this process are unintentional.   Neita Carp M.D on 12/14/2017 at 12:42 PM  Between 7am to 6pm - Pager - [phone] After 6pm go to [email or web address] - password [email or web address] [facility]  [facility]  Office  [phone]  CC: Primary care physician; Glendon Axe, MD

## 2017-12-14 NOTE — Progress Notes (Signed)
Advance care planning  Patient admitted for acute on chronic diastolic congestive heart failure, COPD exacerbation, acute kidney injury over CKD stage III.  Discussed with patient briefly.  He is hard of hearing.  He defers discussion to his stepson James Collins.  Called James Collins from patient's room and we discussed regarding his CHF, COPD and CKD.  Initially in the emergency room patient's stepson mentioned that patient is a DO NOT RESUSCITATE.  Later on discussing further patient has expressed that he would like to be resuscitated but not placed on a ventilator.  Refused intubation.  We discussed regarding partial code.  Orders entered.  [place] requesting that patient be discharged to skilled nursing facility due to weakness.  Understands poor long-term prognosis.  Time spent 20 minutes

## 2017-12-15 ENCOUNTER — Inpatient Hospital Stay: Payer: Medicare Other

## 2017-12-15 LAB — CBC
HEMATOCRIT: 26.5 % — AB (ref 40.0–52.0)
Hemoglobin: 8.6 g/dL — ABNORMAL LOW (ref 13.0–18.0)
MCH: 27.8 pg (ref 26.0–34.0)
MCHC: 32.2 g/dL (ref 32.0–36.0)
MCV: 86.2 fL (ref 80.0–100.0)
PLATELETS: 112 10*3/uL — AB (ref 150–440)
RBC: 3.08 MIL/uL — ABNORMAL LOW (ref 4.40–5.90)
RDW: 15.7 % — AB (ref 11.5–14.5)
WBC: 8.7 10*3/uL (ref 3.8–10.6)

## 2017-12-15 LAB — BASIC METABOLIC PANEL
Anion gap: 10 (ref 5–15)
BUN: 71 mg/dL — ABNORMAL HIGH (ref 6–20)
CALCIUM: 8.3 mg/dL — AB (ref 8.9–10.3)
CO2: 30 mmol/L (ref 22–32)
Chloride: 104 mmol/L (ref 101–111)
Creatinine, Ser: 2.47 mg/dL — ABNORMAL HIGH (ref 0.61–1.24)
GFR, EST AFRICAN AMERICAN: 27 mL/min — AB (ref >60)
GFR, EST NON AFRICAN AMERICAN: 23 mL/min — AB (ref >60)
GLUCOSE: 160 mg/dL — AB (ref 65–99)
Potassium: 4.3 mmol/L (ref 3.5–5.1)
Sodium: 144 mmol/L (ref 135–145)

## 2017-12-15 LAB — GLUCOSE, CAPILLARY
GLUCOSE-CAPILLARY: 176 mg/dL — AB (ref 65–99)
Glucose-Capillary: 124 mg/dL — ABNORMAL HIGH (ref 65–99)
Glucose-Capillary: 171 mg/dL — ABNORMAL HIGH (ref 65–99)
Glucose-Capillary: 180 mg/dL — ABNORMAL HIGH (ref 65–99)

## 2017-12-15 NOTE — Progress Notes (Signed)
Attempted to zero bed with pt. In lift. Bed said it was zeroed, when pt was placed back in bed and he was weighed the bed stated he weighed 78 lbs. So pt. Was weight on lift and he weighed 315.3 lbs. Pt had a weight increase related to the different scales.

## 2017-12-15 NOTE — Progress Notes (Signed)
[facility]  ROUNDING NOTE   Subjective:   UOP 750  Patient's family is concerned that patient is giving up.   Objective:  Vital signs in last 24 hours:  Temp:  [97.8 F (36.6 C)-98.4 F (36.9 C)] 97.8 F (36.6 C) (03/17 0304) Pulse Rate:  [64-78] 67 (03/17 0803) Resp:  [18] 18 (03/17 0803) BP: (138-149)/(53-58) 139/56 (03/17 0803) SpO2:  [96 %-98 %] 97 % (03/17 0803) Weight:  [143 kg (315 lb 4.8 oz)] 143 kg (315 lb 4.8 oz) (03/17 2119)  Weight change: 9.662 kg (21 lb 4.8 oz) Filed Weights   12/13/17 0417 12/14/17 0316 12/15/17 0638  Weight: (!) 137 kg (302 lb) 133.4 kg (294 lb) (!) 143 kg (315 lb 4.8 oz)    Intake/Output: I/O last 3 completed shifts: In: 240 [P.O.:240] Out: 1475 [Urine:1475]   Intake/Output this shift:  Total I/O In: 3 [I.V.:3] Out: -   Physical Exam: General: NAD, sitting in chair  Head: +hard of hearing  Eyes: Anicteric, PERRL  Neck: Supple, trachea midline  Lungs:  +wheezing  Heart: Regular rate and rhythm  Abdomen:  Soft, nontender, obese  Extremities: ++ peripheral edema.  Neurologic: Nonfocal, moving all four extremities  Skin: No lesions       Basic Metabolic Panel: Recent Labs  Lab 12/11/17 0631 12/12/17 0529 12/13/17 0442 12/14/17 0444 12/14/17 0946 12/15/17 0528  NA 141 142 141 143  --  144  K 4.9 4.8 5.0 5.2*  --  4.3  CL 107 106 101 105  --  104  CO2 27 27 28 29   --  30  GLUCOSE 139* 171* 139* 138*  --  160*  BUN 39* 45* 58* 65*  --  71*  CREATININE 2.38* 2.46* 2.85* 2.69*  --  2.47*  CALCIUM 8.7* 8.4* 8.4* 8.5*  --  8.3*  PHOS  --   --   --   --  4.1  --     Liver Function Tests: No results for input(s): AST, ALT, ALKPHOS, BILITOT, PROT, ALBUMIN in the last 168 hours. No results for input(s): LIPASE, AMYLASE in the last 168 hours. No results for input(s): AMMONIA in the last 168 hours.  CBC: Recent Labs  Lab 12/11/17 0631 12/12/17 0529 12/15/17 0528  WBC 6.4 6.3 8.7  NEUTROABS 3.5  --   --    HGB 9.9* 8.4* 8.6*  HCT 30.8* 26.3* 26.5*  MCV 87.1 86.3 86.2  PLT 128* 117* 112*    Cardiac Enzymes: Recent Labs  Lab 12/11/17 0631  TROPONINI <0.03    BNP: Invalid input(s): POCBNP  CBG: Recent Labs  Lab 12/14/17 1140 12/14/17 1640 12/14/17 2055 12/15/17 0802 12/15/17 1133  GLUCAP 173* 215* 210* 124* 171*    Microbiology: Results for orders placed or performed during the hospital encounter of 08/13/17  Urine Culture     Status: Abnormal   Collection Time: 08/13/17  2:22 PM  Result Value Ref Range Status   Specimen Description URINE, RANDOM  Final   Special Requests NONE  Final   Culture MULTIPLE SPECIES PRESENT, SUGGEST RECOLLECTION (A)  Final   Report Status 08/15/2017 FINAL  Final    Coagulation Studies: No results for input(s): LABPROT, INR in the last 72 hours.  Urinalysis: Recent Labs    12/14/17 1800  COLORURINE YELLOW*  LABSPEC 1.015  PHURINE 8.0  GLUCOSEU NEGATIVE  HGBUR NEGATIVE  BILIRUBINUR NEGATIVE  KETONESUR NEGATIVE  PROTEINUR 100*  NITRITE NEGATIVE  LEUKOCYTESUR NEGATIVE  Imaging: US Renal  Result Date: 12/15/2017 CLINICAL DATA:  Patient with acute renal failure. EXAM: RENAL / URINARY TRACT ULTRASOUND COMPLETE COMPARISON:  None. FINDINGS: Very limited exam due to body habitus. Right Kidney: Length: 8.3 cm. Echogenicity within normal limits. No mass or hydronephrosis visualized. Left Kidney: Length: 10.4 cm. Echogenicity within normal limits. No mass or hydronephrosis visualized. Bladder: Not visualized IMPRESSION: No hydronephrosis. Limited evaluation due to body habitus. Electronically Signed   By: Lovey Newcomer M.D.   On: 12/15/2017 10:14   Dg Chest Port 1 View  Result Date: 12/15/2017 CLINICAL DATA:  Follow-up evaluation.  History of CHF. EXAM: PORTABLE CHEST 1 VIEW COMPARISON:  Chest radiograph 12/11/2017. FINDINGS: Monitoring leads overlie the patient. Stable cardiac and mediastinal contours. Pulmonary vascular redistribution  and mild bilateral interstitial pulmonary opacities. Small left pleural effusion. No pneumothorax. IMPRESSION: Cardiomegaly and mild interstitial pulmonary edema. Electronically Signed   By: Lovey Newcomer M.D.   On: 12/15/2017 11:14     Medications:    . amLODipine  5 mg Oral Daily  . aspirin EC  81 mg Oral Daily  . chlorhexidine  15 mL Mouth Rinse BID  . finasteride  5 mg Oral Daily  . heparin  5,000 Units Subcutaneous Q8H  . insulin aspart  0-15 Units Subcutaneous TID WC  . insulin aspart  0-5 Units Subcutaneous QHS  . ipratropium-albuterol  3 mL Nebulization Q6H  . mouth rinse  15 mL Mouth Rinse q12n4p  . mirtazapine  15 mg Oral QHS  . pantoprazole  40 mg Oral Daily  . predniSONE  20 mg Oral Q breakfast  . protein supplement shake  11 oz Oral BID BM  . sodium chloride flush  3 mL Intravenous Q12H  . tamsulosin  0.4 mg Oral QHS   acetaminophen **OR** acetaminophen, albuterol, calcium carbonate, guaiFENesin-codeine, hydrALAZINE, ondansetron **OR** ondansetron (ZOFRAN) IV, polyethylene glycol, sodium chloride flush  Assessment/ Plan:  [name] is a 83 y.o. white male with congestive heart failure, lymphedema, COPD, hypertension, diabetes mellitus type II, morbid obesity, who was admitted to [facility] on 12/11/2017 for acute exacerbation of congestive heart failure  1. Acute renal failure with hyperkalemia on chronic kidney disease stage III with proteinuria: baseline creatinine of 1.88, GFR of 32, on 10/25/17.  Chronic kidney disease secondary to hypertension and diabetes Acute renal failure is acute cardiorenal syndrome versus overdiuresis from loop diuretics.  Hyperkalemia from potassium supplementation Ultrasound without obstruction - Continue to hold diuretics. Home regimen of furosemide 20mg  daily.   - Hold potassium supplementation - Holding benazepril.  - Pending SPEP/UPEP, hepatitis screen, HIV, PTH - Discussed with Son, James Collins, patient was not interested in dialysis.    2. Hypertension: with acute exacerbation of congestive heart failure with preserved systolic function on echocardiogram from 07/09/2017 Home regimen of furosemide, benazepril, and amlodipine.  Chronic lymphedema - Currently on amlodipine  3. Anemia with chronic kidney disease and thrombocytopenia: hemoglobin 8.4 normocytic. Iron studies with lower end of normal levels.   4. Acute exacerbation of COPD: on oxygen.  - Continue supportive care, O2, systemic steroids, nebs.       LOS: 4 James Collins 3/17/201911:57 AM

## 2017-12-15 NOTE — Progress Notes (Signed)
[facility] [place] NAME: James Collins    MR#:  [id number]  DATE OF BIRTH:  1935-01-09  SUBJECTIVE:   Hard of hearing  More wheezy and SOB earlier  REVIEW OF SYSTEMS:  CONSTITUTIONAL: Fatigue EYES: No blurred or double vision.  EARS, NOSE, AND THROAT: No tinnitus or ear pain.  RESPIRATORY: Has cough and shortness of breath. CARDIOVASCULAR: No chest pain, orthopnea, edema.  GASTROINTESTINAL: No nausea, vomiting, diarrhea or abdominal pain.  GENITOURINARY: No dysuria, hematuria.  ENDOCRINE: No polyuria, nocturia,  HEMATOLOGY: No anemia, easy bruising or bleeding SKIN: No rash or lesion. MUSCULOSKELETAL: No joint pain or arthritis.   NEUROLOGIC: No tingling, numbness, weakness.  PSYCHIATRY: No anxiety or depression.   DRUG ALLERGIES:   Allergies  Allergen Reactions  . Penicillins     VITALS:  Blood pressure (!) 139/56, pulse 67, temperature 97.8 F (36.6 C), temperature source Oral, resp. rate 18, height 5\' 7"  (1.702 m), weight (!) 143 kg (315 lb 4.8 oz), SpO2 97 %.  PHYSICAL EXAMINATION:  GENERAL:  83 y.o.-year-old patient lying in the bed with no acute distress.  Obese EYES: Pupils equal, round, reactive to light and accommodation. No scleral icterus. Extraocular muscles intact.  HEENT: Head atraumatic, normocephalic. Oropharynx and nasopharynx clear.  NECK:  Supple, no jugular venous distention. No thyroid enlargement, no tenderness.  LUNGS: Bilateral wheezing and decreased air entry CARDIOVASCULAR: S1, S2 normal. No murmurs, rubs, or gallops.  ABDOMEN: Soft, nontender, nondistended. Bowel sounds present. No organomegaly or mass.  EXTREMITIES: Chronic venous changes on bilateral lower extremities, no cyanosis, or clubbing.  NEUROLOGIC: Cranial nerves II through XII are intact. Muscle strength generalized weakness in all extremities. Sensation intact. Gait not checked.  PSYCHIATRIC: The patient is alert and oriented  x 3.  SKIN: Bilateral lower extremity thickened skin  LABORATORY PANEL:   CBC Recent Labs  Lab 12/15/17 0528  WBC 8.7  HGB 8.6*  HCT 26.5*  PLT 112*   ------------------------------------------------------------------------------------------------------------------  Chemistries  Recent Labs  Lab 12/15/17 0528  NA 144  K 4.3  CL 104  CO2 30  GLUCOSE 160*  BUN 71*  CREATININE 2.47*  CALCIUM 8.3*   ------------------------------------------------------------------------------------------------------------------  Cardiac Enzymes Recent Labs  Lab 12/11/17 0631  TROPONINI <0.03   ------------------------------------------------------------------------------------------------------------------  RADIOLOGY:  US Renal  Result Date: 12/15/2017 CLINICAL DATA:  Patient with acute renal failure. EXAM: RENAL / URINARY TRACT ULTRASOUND COMPLETE COMPARISON:  None. FINDINGS: Very limited exam due to body habitus. Right Kidney: Length: 8.3 cm. Echogenicity within normal limits. No mass or hydronephrosis visualized. Left Kidney: Length: 10.4 cm. Echogenicity within normal limits. No mass or hydronephrosis visualized. Bladder: Not visualized IMPRESSION: No hydronephrosis. Limited evaluation due to body habitus. Electronically Signed   By: Lovey Newcomer M.D.   On: 12/15/2017 10:14   Dg Chest Port 1 View  Result Date: 12/15/2017 CLINICAL DATA:  Follow-up evaluation.  History of CHF. EXAM: PORTABLE CHEST 1 VIEW COMPARISON:  Chest radiograph 12/11/2017. FINDINGS: Monitoring leads overlie the patient. Stable cardiac and mediastinal contours. Pulmonary vascular redistribution and mild bilateral interstitial pulmonary opacities. Small left pleural effusion. No pneumothorax. IMPRESSION: Cardiomegaly and mild interstitial pulmonary edema. Electronically Signed   By: Lovey Newcomer M.D.   On: 12/15/2017 11:14    EKG:   Orders placed or performed during the hospital encounter of 12/11/17  . ED EKG  .  ED EKG    ASSESSMENT AND PLAN:    * Acute on chronic diastolic  congestive heart failure -Lasix held due to worsening renal function - In take and Output and daily weight monitoring - Monitor Bun/Cr and Potassium - Echo -from October 2018 ejection fraction 65-70%, normal overall wall motion. - Cardiology follow up after discharge  Repeat CXR today  *Acute COPD exacerbation -Decreased dose of prednisone - Nebulizers, Inhalers -Wean O2 as tolerated Influenza negative  *Diabetes mellitus, uncontrolled with hyperglycemia due to steroids.  Sliding scale insulin.  Steroid dose decreased today.  *Acute kidney injury over CKD stage III.  With hyperkalemia   Hold Lasix and potassium supplements Creatinine baseline seems to be at 1.8, during this admission  2.38-2.46 -2.85- 2.69-2.47 Repeat labs in the morning.  Monitor input and output. Nephrology consulted.  Appreciate input. Discussed with Dr. Juleen China. May need HD if worsening  *DVT prophylaxis with heparin subcutaneous  Discharge in 1-2 days to skilled nursing facility.  All the records are reviewed and case discussed with Care Management/Social Workerr. Management plans discussed with the patient, family and they are in agreement.  CODE STATUS: Partial code.  No intubation but okay to be resuscitated.  TOTAL TIME TAKING CARE OF THIS PATIENT: 35 minutes.   POSSIBLE D/C IN 1-2 DAYS, DEPENDING ON CLINICAL CONDITION.  Note: This dictation was prepared with Dragon dictation along with smaller phrase technology. Any transcriptional errors that result from this process are unintentional.   Neita Carp M.D on 12/15/2017 at 12:58 PM  Between 7am to 6pm - Pager - [phone] After 6pm go to [email or web address] - password [email or web address] [facility]  [facility]  Office  [phone]  CC: Primary care physician; Glendon Axe, MD

## 2017-12-15 NOTE — Plan of Care (Signed)
  Progressing Clinical Measurements: Will remain free from infection 12/15/2017 1654 - Progressing by Liliane Channel, RN 12/15/2017 1653 - Progressing by Liliane Channel, RN Respiratory complications will improve 12/15/2017 1654 - Progressing by Liliane Channel, RN 12/15/2017 1653 - Progressing by Liliane Channel, RN Safety: Ability to remain free from injury will improve 12/15/2017 1654 - Progressing by Liliane Channel, RN 12/15/2017 1653 - Progressing by Liliane Channel, RN Skin Integrity: Risk for impaired skin integrity will decrease 12/15/2017 1654 - Progressing by Liliane Channel, RN 12/15/2017 1653 - Progressing by Liliane Channel, RN

## 2017-12-15 NOTE — Plan of Care (Signed)
  Progressing Clinical Measurements: Will remain free from infection 12/15/2017 1653 - Progressing by Liliane Channel, RN Respiratory complications will improve 12/15/2017 1653 - Progressing by Liliane Channel, RN Safety: Ability to remain free from injury will improve 12/15/2017 1653 - Progressing by Liliane Channel, RN Skin Integrity: Risk for impaired skin integrity will decrease 12/15/2017 1653 - Progressing by Liliane Channel, RN

## 2017-12-15 NOTE — Clinical Social Work Note (Signed)
The CSW contacted the patient's son to discuss bed offers. The patient's son chose [facility]. The CSW has updated such in the [place]. CSW is following.  Santiago Bumpers, MSW, Latanya Presser [phone]

## 2017-12-16 LAB — PARATHYROID HORMONE, INTACT (NO CA): PTH: 37 pg/mL (ref 15–65)

## 2017-12-16 LAB — GLUCOSE, CAPILLARY
GLUCOSE-CAPILLARY: 173 mg/dL — AB (ref 65–99)
Glucose-Capillary: 122 mg/dL — ABNORMAL HIGH (ref 65–99)
Glucose-Capillary: 168 mg/dL — ABNORMAL HIGH (ref 65–99)
Glucose-Capillary: 209 mg/dL — ABNORMAL HIGH (ref 65–99)

## 2017-12-16 LAB — BASIC METABOLIC PANEL
Anion gap: 8 (ref 5–15)
BUN: 72 mg/dL — ABNORMAL HIGH (ref 6–20)
CALCIUM: 8.3 mg/dL — AB (ref 8.9–10.3)
CO2: 30 mmol/L (ref 22–32)
Chloride: 104 mmol/L (ref 101–111)
Creatinine, Ser: 2.35 mg/dL — ABNORMAL HIGH (ref 0.61–1.24)
GFR calc Af Amer: 28 mL/min — ABNORMAL LOW (ref >60)
GFR, EST NON AFRICAN AMERICAN: 25 mL/min — AB (ref >60)
GLUCOSE: 139 mg/dL — AB (ref 65–99)
Potassium: 3.9 mmol/L (ref 3.5–5.1)
Sodium: 142 mmol/L (ref 135–145)

## 2017-12-16 MED ORDER — POTASSIUM CHLORIDE CRYS ER 10 MEQ PO TBCR
10.0000 meq | EXTENDED_RELEASE_TABLET | Freq: Two times a day (BID) | ORAL | Status: DC
  Administered 2017-12-16 – 2017-12-19 (×7): 10 meq via ORAL
  Filled 2017-12-16 (×7): qty 1

## 2017-12-16 MED ORDER — FUROSEMIDE 40 MG PO TABS
40.0000 mg | ORAL_TABLET | Freq: Two times a day (BID) | ORAL | Status: DC
  Administered 2017-12-16 – 2017-12-18 (×4): 40 mg via ORAL
  Filled 2017-12-16 (×4): qty 1

## 2017-12-16 MED ORDER — METOPROLOL TARTRATE 25 MG PO TABS
12.5000 mg | ORAL_TABLET | Freq: Two times a day (BID) | ORAL | Status: DC
  Administered 2017-12-16 – 2017-12-19 (×7): 12.5 mg via ORAL
  Filled 2017-12-16 (×7): qty 1

## 2017-12-16 NOTE — Progress Notes (Signed)
Pt. Was weight on skylift this A.M., his weight was 317.0 lbs.

## 2017-12-16 NOTE — Care Management Important Message (Signed)
Important Message  Patient Details  Name: James Collins MRN: [id number] Date of Birth: March 10, 1935   Medicare Important Message Given:  Yes  Signed IM notice given   Katrina Stack, RN 12/16/2017, 11:28 AM

## 2017-12-16 NOTE — Progress Notes (Signed)
[facility] [place] NAME: James Collins    MR#:  [id number]  DATE OF BIRTH:  08/04/1935  SUBJECTIVE:   Hard of hearing  More wheezy and SOB earlier.  REVIEW OF SYSTEMS:  CONSTITUTIONAL: Fatigue EYES: No blurred or double vision.  EARS, NOSE, AND THROAT: No tinnitus or ear pain.  RESPIRATORY: Has cough and shortness of breath. CARDIOVASCULAR: No chest pain, orthopnea, edema.  GASTROINTESTINAL: No nausea, vomiting, diarrhea or abdominal pain.  GENITOURINARY: No dysuria, hematuria.  ENDOCRINE: No polyuria, nocturia,  HEMATOLOGY: No anemia, easy bruising or bleeding SKIN: No rash or lesion. MUSCULOSKELETAL: No joint pain or arthritis.   NEUROLOGIC: No tingling, numbness, weakness.  PSYCHIATRY: No anxiety or depression.   DRUG ALLERGIES:   Allergies  Allergen Reactions  . Penicillins     VITALS:  Blood pressure (!) 122/59, pulse 69, temperature 98.4 F (36.9 C), temperature source Oral, resp. rate 18, height 5\' 7"  (1.702 m), weight (!) 143.8 kg (317 lb), SpO2 98 %.  PHYSICAL EXAMINATION:  GENERAL:  83 y.o.-year-old patient lying in the bed with no acute distress.  Obese EYES: Pupils equal, round, reactive to light and accommodation. No scleral icterus. Extraocular muscles intact.  HEENT: Head atraumatic, normocephalic. Oropharynx and nasopharynx clear.  NECK:  Supple, no jugular venous distention. No thyroid enlargement, no tenderness.  LUNGS: Bilateral wheezing and decreased air entry CARDIOVASCULAR: S1, S2 normal. No murmurs, rubs, or gallops.  ABDOMEN: Soft, nontender, nondistended. Bowel sounds present. No organomegaly or mass.  EXTREMITIES: Chronic venous changes on bilateral lower extremities, no cyanosis, or clubbing.  NEUROLOGIC: Cranial nerves II through XII are intact. Muscle strength generalized weakness in all extremities. Sensation intact. Gait not checked.  PSYCHIATRIC: The patient is alert and oriented x  3.  SKIN: Bilateral lower extremity thickened skin  LABORATORY PANEL:   CBC Recent Labs  Lab 12/15/17 0528  WBC 8.7  HGB 8.6*  HCT 26.5*  PLT 112*   ------------------------------------------------------------------------------------------------------------------  Chemistries  Recent Labs  Lab 12/16/17 0519  NA 142  K 3.9  CL 104  CO2 30  GLUCOSE 139*  BUN 72*  CREATININE 2.35*  CALCIUM 8.3*   ------------------------------------------------------------------------------------------------------------------  Cardiac Enzymes Recent Labs  Lab 12/11/17 0631  TROPONINI <0.03   ------------------------------------------------------------------------------------------------------------------  RADIOLOGY:  US Renal  Result Date: 12/15/2017 CLINICAL DATA:  Patient with acute renal failure. EXAM: RENAL / URINARY TRACT ULTRASOUND COMPLETE COMPARISON:  None. FINDINGS: Very limited exam due to body habitus. Right Kidney: Length: 8.3 cm. Echogenicity within normal limits. No mass or hydronephrosis visualized. Left Kidney: Length: 10.4 cm. Echogenicity within normal limits. No mass or hydronephrosis visualized. Bladder: Not visualized IMPRESSION: No hydronephrosis. Limited evaluation due to body habitus. Electronically Signed   By: Lovey Newcomer M.D.   On: 12/15/2017 10:14   Dg Chest Port 1 View  Result Date: 12/15/2017 CLINICAL DATA:  Follow-up evaluation.  History of CHF. EXAM: PORTABLE CHEST 1 VIEW COMPARISON:  Chest radiograph 12/11/2017. FINDINGS: Monitoring leads overlie the patient. Stable cardiac and mediastinal contours. Pulmonary vascular redistribution and mild bilateral interstitial pulmonary opacities. Small left pleural effusion. No pneumothorax. IMPRESSION: Cardiomegaly and mild interstitial pulmonary edema. Electronically Signed   By: Lovey Newcomer M.D.   On: 12/15/2017 11:14    EKG:   Orders placed or performed during the hospital encounter of 12/11/17  . ED EKG  .  ED EKG    ASSESSMENT AND PLAN:    * Acute on chronic diastolic congestive heart  failure -Lasix held due to worsening renal function - In take and Output and daily weight monitoring - Monitor Bun/Cr and Potassium - Echo -from October 2018 ejection fraction 65-70%, normal overall wall motion. - Cardiology follow up after discharge  *Acute COPD exacerbation -Decreased dose of prednisone - Nebulizers, Inhalers -Wean O2 as tolerated Influenza negative  *Diabetes mellitus, uncontrolled with hyperglycemia due to steroids.  Sliding scale insulin.  Steroid dose decreased today.  *Acute kidney injury over CKD stage III.  With hyperkalemia   Hold Lasix and potassium supplements Creatinine baseline seems to be at 1.8, during this admission  2.38-2.46 -2.85- 2.69-2.47- 2.35 Repeat labs in the morning.  Monitor input and output. Nephrology consulted.  Appreciate input. Now resume lasix and monitor.  *DVT prophylaxis with heparin subcutaneous  Discharge in 1-2 days to skilled nursing facility.   Cont PT eval.  All the records are reviewed and case discussed with Care Management/Social Workerr. Management plans discussed with the patient, family and they are in agreement.  CODE STATUS: Partial code.  No intubation but okay to be resuscitated.  TOTAL TIME TAKING CARE OF THIS PATIENT: 35 minutes.   POSSIBLE D/C IN 1-2 DAYS, DEPENDING ON CLINICAL CONDITION.  Note: This dictation was prepared with Dragon dictation along with smaller phrase technology. Any transcriptional errors that result from this process are unintentional.   Vaughan Basta M.D on 12/16/2017 at 9:13 PM  Between 7am to 6pm - Pager - [phone] After 6pm go to [email or web address] - password [email or web address] [facility]  [facility]  Office  [phone]  CC: Primary care physician; Glendon Axe, MD

## 2017-12-16 NOTE — Progress Notes (Signed)
Pt. Slept in recliner chair throughout the night.

## 2017-12-16 NOTE — Progress Notes (Signed)
Physical Therapy Treatment Patient Details Name: James Collins MRN: [id number] DOB: 07/29/1935 Today's Date: 12/16/2017    History of Present Illness 83 yo male with onset of CHF and cardiomegaly noted, has acute COPD flare, AKI with CKD 3.  PMHx:  DM, groin edema, CHF, COPD, LE edema, CKD, HTN, lymphedema    PT Comments    Pt agreeable to PT; denies pain. Pt extremely hard of hearing making communication difficult. Pt up in chair prior to session via hoyer lift. Pt states he cannot stand (states has not done so for several weeks). Pt participates with LE exercises in long sit in chair. Can actively move at ankles and demonstrates isometric strengthening with increased instruction/cuenig. Assist required for other exercises with decreased range at hip and knee flexion. R decreased compared to L. Pt encouraged to perform exercises 4-5 times per day. Continue PT to progress strength, endurance, range to improve functional mobility.     Follow Up Recommendations  SNF     Equipment Recommendations       Recommendations for Other Services       Precautions / Restrictions Precautions Precautions: Fall Precaution Comments: (pt states it has been several weeks since he could stand) Restrictions Weight Bearing Restrictions: No    Mobility  Bed Mobility               General bed mobility comments: Not tested; up in chair via hoyer lift  Transfers                 General transfer comment: Pt states he cannot stand and has not done so for several weeks  Ambulation/Gait                 Stairs            Wheelchair Mobility    Modified Rankin (Stroke Patients Only)       Balance                                            Cognition Arousal/Alertness: Awake/alert Behavior During Therapy: WFL for tasks assessed/performed Overall Cognitive Status: Within Functional Limits for tasks assessed                                  General Comments: Extremely HOH      Exercises General Exercises - Lower Extremity Ankle Circles/Pumps: AROM;Both;20 reps(2 sets) Quad Sets: Strengthening;Both;10 reps(2 sets) Gluteal Sets: Strengthening;Both;20 reps(2 sets) Heel Slides: AAROM;Left;10 reps(2 sets; attempted R unable with assist) Hip ABduction/ADduction: AAROM;Both;10 reps(2 sets)    General Comments        Pertinent Vitals/Pain Pain Assessment: No/denies pain    Home Living                      Prior Function            PT Goals (current goals can now be found in the care plan section) Progress towards PT goals: Progressing toward goals(slowly)    Frequency    Min 2X/week      PT Plan Current plan remains appropriate    Co-evaluation              AM-PAC PT "6 Clicks" Daily Activity  Outcome Measure  Difficulty turning over in bed (including adjusting bedclothes,  sheets and blankets)?: Unable Difficulty moving from lying on back to sitting on the side of the bed? : Unable Difficulty sitting down on and standing up from a chair with arms (e.g., wheelchair, bedside commode, etc,.)?: Unable Help needed moving to and from a bed to chair (including a wheelchair)?: Total Help needed walking in hospital room?: Total Help needed climbing 3-5 steps with a railing? : Total 6 Click Score: 6    End of Session Equipment Utilized During Treatment: Oxygen Activity Tolerance: Patient tolerated treatment well;Other (comment)(edema, weakness, fatigues easily) Patient left: in chair;with call bell/phone within reach;with chair alarm set   PT Visit Diagnosis: Unsteadiness on feet (R26.81);Muscle weakness (generalized) (M62.81);Difficulty in walking, not elsewhere classified (R26.2);Adult, failure to thrive (R62.7);Other abnormalities of gait and mobility (R26.89)     Time: 6004-5997 PT Time Calculation (min) (ACUTE ONLY): 31 min  Charges:  $Therapeutic Exercise: 23-37 mins                     G Codes:       Larae Grooms, PTA 12/16/2017, 3:46 PM

## 2017-12-16 NOTE — Progress Notes (Signed)
[facility], [place] 12/16/17  Subjective:   Patient states he had a good breakfast Denies any nausea or vomiting Continues to have significant amount of lower extremity edema  Objective:  Vital signs in last 24 hours:  Temp:  [97.6 F (36.4 C)-98.4 F (36.9 C)] 97.6 F (36.4 C) (03/18 0826) Pulse Rate:  [50-129] 50 (03/18 0826) Resp:  [18] 18 (03/18 0826) BP: (117-143)/(45-78) 134/53 (03/18 0826) SpO2:  [93 %-97 %] 96 % (03/18 0828) Weight:  [143.8 kg (317 lb)] 143.8 kg (317 lb) (03/18 0500)  Weight change: 0.771 kg (1 lb 11.2 oz) Filed Weights   12/14/17 0316 12/15/17 0638 12/16/17 0500  Weight: 133.4 kg (294 lb) (!) 143 kg (315 lb 4.8 oz) (!) 143.8 kg (317 lb)    Intake/Output:    Intake/Output Summary (Last 24 hours) at 12/16/2017 1006 Last data filed at 12/16/2017 0828 Gross per 24 hour  Intake 3 ml  Output 1150 ml  Net -1147 ml     Physical Exam: General:  Sitting up in the chair, no acute distress  HEENT  anicteric, moist oral mucous membranes, decreased hearing  Neck  supple   Pulm/lungs  bibasilar crackles  CVS/Heart  irregular rhythm  Abdomen:   Soft  Extremities:  2-3+ edema, lymphedema  Neurologic:  Alert, oriented,  Skin: Hypertrophic nodular changes from chronic edema          Basic Metabolic Panel:  Recent Labs  Lab 12/12/17 0529 12/13/17 0442 12/14/17 0444 12/14/17 0946 12/15/17 0528 12/16/17 0519  NA 142 141 143  --  144 142  K 4.8 5.0 5.2*  --  4.3 3.9  CL 106 101 105  --  104 104  CO2 27 28 29   --  30 30  GLUCOSE 171* 139* 138*  --  160* 139*  BUN 45* 58* 65*  --  71* 72*  CREATININE 2.46* 2.85* 2.69*  --  2.47* 2.35*  CALCIUM 8.4* 8.4* 8.5*  --  8.3* 8.3*  PHOS  --   --   --  4.1  --   --      CBC: Recent Labs  Lab 12/11/17 0631 12/12/17 0529 12/15/17 0528  WBC 6.4 6.3 8.7  NEUTROABS 3.5  --   --   HGB 9.9* 8.4* 8.6*  HCT 30.8* 26.3* 26.5*  MCV 87.1 86.3 86.2  PLT 128* 117* 112*     No results found for: HEPBSAG, HEPBSAB, HEPBIGM    Microbiology:  No results found for this or any previous visit (from the past 240 hour(s)).  Coagulation Studies: No results for input(s): LABPROT, INR in the last 72 hours.  Urinalysis: Recent Labs    12/14/17 1800  COLORURINE YELLOW*  LABSPEC 1.015  PHURINE 8.0  GLUCOSEU NEGATIVE  HGBUR NEGATIVE  BILIRUBINUR NEGATIVE  KETONESUR NEGATIVE  PROTEINUR 100*  NITRITE NEGATIVE  LEUKOCYTESUR NEGATIVE      Imaging: US Renal  Result Date: 12/15/2017 CLINICAL DATA:  Patient with acute renal failure. EXAM: RENAL / URINARY TRACT ULTRASOUND COMPLETE COMPARISON:  None. FINDINGS: Very limited exam due to body habitus. Right Kidney: Length: 8.3 cm. Echogenicity within normal limits. No mass or hydronephrosis visualized. Left Kidney: Length: 10.4 cm. Echogenicity within normal limits. No mass or hydronephrosis visualized. Bladder: Not visualized IMPRESSION: No hydronephrosis. Limited evaluation due to body habitus. Electronically Signed   By: Lovey Newcomer M.D.   On: 12/15/2017 10:14   Dg Chest Port 1 View  Result Date: 12/15/2017 CLINICAL DATA:  Follow-up evaluation.  History of CHF. EXAM: PORTABLE CHEST 1 VIEW COMPARISON:  Chest radiograph 12/11/2017. FINDINGS: Monitoring leads overlie the patient. Stable cardiac and mediastinal contours. Pulmonary vascular redistribution and mild bilateral interstitial pulmonary opacities. Small left pleural effusion. No pneumothorax. IMPRESSION: Cardiomegaly and mild interstitial pulmonary edema. Electronically Signed   By: Lovey Newcomer M.D.   On: 12/15/2017 11:14     Medications:    . aspirin EC  81 mg Oral Daily  . chlorhexidine  15 mL Mouth Rinse BID  . finasteride  5 mg Oral Daily  . furosemide  40 mg Oral BID  . heparin  5,000 Units Subcutaneous Q8H  . insulin aspart  0-15 Units Subcutaneous TID WC  . insulin aspart  0-5 Units Subcutaneous QHS  . ipratropium-albuterol  3 mL Nebulization Q6H  .  mouth rinse  15 mL Mouth Rinse q12n4p  . mirtazapine  15 mg Oral QHS  . pantoprazole  40 mg Oral Daily  . potassium chloride  10 mEq Oral BID  . predniSONE  20 mg Oral Q breakfast  . protein supplement shake  11 oz Oral BID BM  . sodium chloride flush  3 mL Intravenous Q12H  . tamsulosin  0.4 mg Oral QHS   acetaminophen **OR** acetaminophen, albuterol, calcium carbonate, guaiFENesin-codeine, hydrALAZINE, ondansetron **OR** ondansetron (ZOFRAN) IV, polyethylene glycol, sodium chloride flush  Assessment/ Plan:  83 y.o. male with congestive heart failure, lymphedema, COPD, hypertension, diabetes mellitus type II, morbid obesity,who was [facility]for acute exacerbation of congestive heart failure  1. Acute renal failure with hyperkalemia on chronic kidney disease stage III with proteinuria: baseline creatinine of 1.88, GFR of 32, on 10/25/17.  Chronic kidney disease secondary to hypertension and diabetes Acute renal failure is acute cardiorenal syndrome versus overdiuresis from loop diuretics.  Hyperkalemia from potassium supplementation Ultrasound without obstruction UOP appears to be increasing S Creatinine is a little lower  2. LE edema and lymphedema - add lasix  - monitor volume status and electrolytes closely  3.  HTN - d/c amlodipine as it may be contributing to the LE edema - start low dose metoprolol    LOS: [place] 3/18/201910:06 AM  [place], [place]  Note: This note was prepared with Dragon dictation. Any transcription errors are unintentional

## 2017-12-17 LAB — MAGNESIUM: Magnesium: 2.2 mg/dL (ref 1.7–2.4)

## 2017-12-17 LAB — HEPATITIS B SURFACE ANTIBODY,QUALITATIVE: Hep B S Ab: NONREACTIVE

## 2017-12-17 LAB — PROTEIN ELECTRO, RANDOM URINE
ALPHA-2-GLOBULIN, U: 3.5 %
Albumin ELP, Urine: 75.4 %
Alpha-1-Globulin, U: 3.2 %
BETA GLOBULIN, U: 10.4 %
GAMMA GLOBULIN, U: 7.5 %
TOTAL PROTEIN, URINE-UPE24: 99.4 mg/dL

## 2017-12-17 LAB — PROTEIN ELECTROPHORESIS, SERUM
A/G Ratio: 0.9 (ref 0.7–1.7)
Albumin ELP: 3 g/dL (ref 2.9–4.4)
Alpha-1-Globulin: 0.3 g/dL (ref 0.0–0.4)
Alpha-2-Globulin: 0.9 g/dL (ref 0.4–1.0)
BETA GLOBULIN: 0.7 g/dL (ref 0.7–1.3)
GAMMA GLOBULIN: 1.4 g/dL (ref 0.4–1.8)
Globulin, Total: 3.2 g/dL (ref 2.2–3.9)
M-SPIKE, %: 0.5 g/dL — AB
Total Protein ELP: 6.2 g/dL (ref 6.0–8.5)

## 2017-12-17 LAB — BASIC METABOLIC PANEL
ANION GAP: 9 (ref 5–15)
BUN: 82 mg/dL — ABNORMAL HIGH (ref 6–20)
CALCIUM: 8.2 mg/dL — AB (ref 8.9–10.3)
CO2: 29 mmol/L (ref 22–32)
Chloride: 102 mmol/L (ref 101–111)
Creatinine, Ser: 2.55 mg/dL — ABNORMAL HIGH (ref 0.61–1.24)
GFR, EST AFRICAN AMERICAN: 26 mL/min — AB (ref >60)
GFR, EST NON AFRICAN AMERICAN: 22 mL/min — AB (ref >60)
Glucose, Bld: 141 mg/dL — ABNORMAL HIGH (ref 65–99)
POTASSIUM: 4.3 mmol/L (ref 3.5–5.1)
Sodium: 140 mmol/L (ref 135–145)

## 2017-12-17 LAB — HIV ANTIBODY (ROUTINE TESTING W REFLEX): HIV SCREEN 4TH GENERATION: NONREACTIVE

## 2017-12-17 LAB — GLUCOSE, CAPILLARY
GLUCOSE-CAPILLARY: 121 mg/dL — AB (ref 65–99)
GLUCOSE-CAPILLARY: 182 mg/dL — AB (ref 65–99)
GLUCOSE-CAPILLARY: 211 mg/dL — AB (ref 65–99)
GLUCOSE-CAPILLARY: 189 mg/dL — AB (ref 65–99)

## 2017-12-17 LAB — KAPPA/LAMBDA LIGHT CHAINS
KAPPA FREE LGHT CHN: 151.3 mg/L — AB (ref 3.3–19.4)
Kappa, lambda light chain ratio: 4.88 — ABNORMAL HIGH (ref 0.26–1.65)
Lambda free light chains: 31 mg/L — ABNORMAL HIGH (ref 5.7–26.3)

## 2017-12-17 LAB — HEPATITIS B CORE ANTIBODY, IGM: Hep B C IgM: NEGATIVE

## 2017-12-17 LAB — HEPATITIS B SURFACE ANTIGEN: HEP B S AG: NEGATIVE

## 2017-12-17 LAB — HEPATITIS C ANTIBODY

## 2017-12-17 MED ORDER — FUROSEMIDE 10 MG/ML IJ SOLN
60.0000 mg | Freq: Once | INTRAMUSCULAR | Status: AC
  Administered 2017-12-17: 60 mg via INTRAVENOUS
  Filled 2017-12-17: qty 6

## 2017-12-17 NOTE — Progress Notes (Signed)
[facility], [place] 12/17/17  Subjective:   Patient sitting up in the chair with multiple family members in the room.  He states that he ate a really good lunch.  He is able to eat and drink without any nausea or vomiting This afternoon, he appears to be short of breath and is asking for breathing treatment Continues to have significant lower extremity edema   Objective:  Vital signs in last 24 hours:  Temp:  [97.6 F (36.4 C)-98.4 F (36.9 C)] 97.8 F (36.6 C) (03/19 0746) Pulse Rate:  [31-71] 31 (03/19 1143) Resp:  [18-19] 18 (03/19 1143) BP: (122-138)/(52-76) 135/76 (03/19 1143) SpO2:  [94 %-99 %] 95 % (03/19 1143)  Weight change:  Filed Weights   12/14/17 0316 12/15/17 0638 12/16/17 0500  Weight: 133.4 kg (294 lb) (!) 143 kg (315 lb 4.8 oz) (!) 143.8 kg (317 lb)    Intake/Output:    Intake/Output Summary (Last 24 hours) at 12/17/2017 1504 Last data filed at 12/17/2017 1034 Gross per 24 hour  Intake 720 ml  Output 160 ml  Net 560 ml     Physical Exam: General:  Sitting up in the chair, no acute distress  HEENT  anicteric, moist oral mucous membranes, decreased hearing  Neck  supple   Pulm/lungs  diffuse bibasilar crackles  CVS/Heart  irregular rhythm  Abdomen:   Soft  Extremities:  2-3+ edema, lymphedema  Neurologic:  Alert, oriented,  Skin: Hypertrophic nodular changes from chronic edema          Basic Metabolic Panel:  Recent Labs  Lab 12/13/17 0442 12/14/17 0444 12/14/17 0946 12/15/17 0528 12/16/17 0519 12/17/17 0433  NA 141 143  --  144 142 140  K 5.0 5.2*  --  4.3 3.9 4.3  CL 101 105  --  104 104 102  CO2 28 29  --  30 30 29   GLUCOSE 139* 138*  --  160* 139* 141*  BUN 58* 65*  --  71* 72* 82*  CREATININE 2.85* 2.69*  --  2.47* 2.35* 2.55*  CALCIUM 8.4* 8.5*  --  8.3* 8.3* 8.2*  MG  --   --   --   --   --  2.2  PHOS  --   --  4.1  --   --   --      CBC: Recent Labs  Lab 12/11/17 0631 12/12/17 0529  12/15/17 0528  WBC 6.4 6.3 8.7  NEUTROABS 3.5  --   --   HGB 9.9* 8.4* 8.6*  HCT 30.8* 26.3* 26.5*  MCV 87.1 86.3 86.2  PLT 128* 117* 112*      Lab Results  Component Value Date   HEPBSAG Negative 12/14/2017   HEPBSAB Non Reactive 12/14/2017   HEPBIGM Negative 12/14/2017      Microbiology:  No results found for this or any previous visit (from the past 240 hour(s)).  Coagulation Studies: No results for input(s): LABPROT, INR in the last 72 hours.  Urinalysis: Recent Labs    12/14/17 1800  COLORURINE YELLOW*  LABSPEC 1.015  PHURINE 8.0  GLUCOSEU NEGATIVE  HGBUR NEGATIVE  BILIRUBINUR NEGATIVE  KETONESUR NEGATIVE  PROTEINUR 100*  NITRITE NEGATIVE  LEUKOCYTESUR NEGATIVE      Imaging: No results found.   Medications:    . aspirin EC  81 mg Oral Daily  . chlorhexidine  15 mL Mouth Rinse BID  . finasteride  5 mg Oral Daily  . furosemide  40 mg Oral  BID  . heparin  5,000 Units Subcutaneous Q8H  . insulin aspart  0-15 Units Subcutaneous TID WC  . insulin aspart  0-5 Units Subcutaneous QHS  . ipratropium-albuterol  3 mL Nebulization Q6H  . mouth rinse  15 mL Mouth Rinse q12n4p  . metoprolol tartrate  12.5 mg Oral BID  . mirtazapine  15 mg Oral QHS  . pantoprazole  40 mg Oral Daily  . potassium chloride  10 mEq Oral BID  . protein supplement shake  11 oz Oral BID BM  . sodium chloride flush  3 mL Intravenous Q12H  . tamsulosin  0.4 mg Oral QHS   acetaminophen **OR** acetaminophen, albuterol, calcium carbonate, guaiFENesin-codeine, hydrALAZINE, ondansetron **OR** ondansetron (ZOFRAN) IV, polyethylene glycol, sodium chloride flush  Assessment/ Plan:  83 y.o. male with congestive heart failure, lymphedema, COPD, hypertension, diabetes mellitus type II, morbid obesity,who was admitted to [facility] on[facility]/13/2019for acute exacerbation of congestive heart failure  1. Acute renal failure with hyperkalemia on chronic kidney disease stage III with proteinuria:  baseline creatinine of 1.88, GFR of 32, on 10/25/17.  Chronic kidney disease secondary to hypertension and diabetes Ultrasound without obstruction Today serum creatinine is slightly higher than yesterday We will continue to monitor closely  2. LE edema and lymphedema.  2D echo from October 2018 shows EF 65-70% - Patient appears to have developed pulmonary edema today - Give 1 dose of IV Lasix in addition to oral Lasix also    3.  HTN - d/c amlodipine as it may be contributing to the LE edema - continue with low dose metoprolol    LOS: [place] 3/19/20193:04 PM  [place], [place]  Note: This note was prepared with Dragon dictation. Any transcription errors are unintentional

## 2017-12-17 NOTE — Progress Notes (Signed)
Pt had 5 beat run of non sustain v-tach.Pt asymptomatic, vss. Dr.Vachhani notified.no new orders receive will continue to monitor.

## 2017-12-18 ENCOUNTER — Inpatient Hospital Stay: Payer: Medicare Other

## 2017-12-18 LAB — BASIC METABOLIC PANEL
ANION GAP: 8 (ref 5–15)
BUN: 84 mg/dL — ABNORMAL HIGH (ref 6–20)
CHLORIDE: 103 mmol/L (ref 101–111)
CO2: 30 mmol/L (ref 22–32)
Calcium: 8.3 mg/dL — ABNORMAL LOW (ref 8.9–10.3)
Creatinine, Ser: 2.17 mg/dL — ABNORMAL HIGH (ref 0.61–1.24)
GFR calc Af Amer: 31 mL/min — ABNORMAL LOW (ref >60)
GFR calc non Af Amer: 27 mL/min — ABNORMAL LOW (ref >60)
GLUCOSE: 152 mg/dL — AB (ref 65–99)
POTASSIUM: 4.1 mmol/L (ref 3.5–5.1)
Sodium: 141 mmol/L (ref 135–145)

## 2017-12-18 LAB — GLUCOSE, CAPILLARY
GLUCOSE-CAPILLARY: 145 mg/dL — AB (ref 65–99)
Glucose-Capillary: 127 mg/dL — ABNORMAL HIGH (ref 65–99)
Glucose-Capillary: 118 mg/dL — ABNORMAL HIGH (ref 65–99)
Glucose-Capillary: 156 mg/dL — ABNORMAL HIGH (ref 65–99)

## 2017-12-18 MED ORDER — FUROSEMIDE 10 MG/ML IJ SOLN
40.0000 mg | Freq: Once | INTRAMUSCULAR | Status: AC
  Administered 2017-12-18: 40 mg via INTRAVENOUS
  Filled 2017-12-18: qty 4

## 2017-12-18 MED ORDER — BISACODYL 10 MG RE SUPP
10.0000 mg | Freq: Once | RECTAL | Status: AC
  Administered 2017-12-18: 10 mg via RECTAL
  Filled 2017-12-18: qty 1

## 2017-12-18 MED ORDER — TORSEMIDE 20 MG PO TABS
40.0000 mg | ORAL_TABLET | Freq: Every day | ORAL | Status: DC
  Administered 2017-12-19: 40 mg via ORAL
  Filled 2017-12-18: qty 2

## 2017-12-18 MED ORDER — SENNOSIDES-DOCUSATE SODIUM 8.6-50 MG PO TABS
1.0000 | ORAL_TABLET | Freq: Two times a day (BID) | ORAL | Status: DC
  Administered 2017-12-18 – 2017-12-19 (×2): 1 via ORAL
  Filled 2017-12-18 (×3): qty 1

## 2017-12-18 NOTE — Care Management Important Message (Signed)
Important Message  Patient Details  Name: James Collins MRN: [id number] Date of Birth: 06-28-1935   Medicare Important Message Given:  Yes    Katrina Stack, RN 12/18/2017, 11:43 AM

## 2017-12-18 NOTE — Progress Notes (Signed)
Pt's weight is 626 on the bed and 339 on sky lift this morning.

## 2017-12-18 NOTE — Progress Notes (Addendum)
Pt's weight this morning is 150 kg on skylift

## 2017-12-18 NOTE — Progress Notes (Signed)
[place] [facility] [place] NAME: James Collins    MR#:  [id number]  DATE OF BIRTH:  May 29, 1935  SUBJECTIVE:   Hard of hearing  Sitting in chair, feels some better.  REVIEW OF SYSTEMS:  CONSTITUTIONAL: Fatigue EYES: No blurred or double vision.  EARS, NOSE, AND THROAT: No tinnitus or ear pain.  RESPIRATORY: Has cough and shortness of breath. CARDIOVASCULAR: No chest pain, orthopnea, edema.  GASTROINTESTINAL: No nausea, vomiting, diarrhea or abdominal pain.  GENITOURINARY: No dysuria, hematuria.  ENDOCRINE: No polyuria, nocturia,  HEMATOLOGY: No anemia, easy bruising or bleeding SKIN: No rash or lesion. MUSCULOSKELETAL: No joint pain or arthritis.   NEUROLOGIC: No tingling, numbness, weakness.  PSYCHIATRY: No anxiety or depression.   DRUG ALLERGIES:   Allergies  Allergen Reactions  . Penicillins     VITALS:  Blood pressure (!) 132/53, pulse 65, temperature 98.1 F (36.7 C), temperature source Oral, resp. rate 20, height 5\' 7"  (1.702 m), weight (!) 143.8 kg (317 lb), SpO2 96 %.  PHYSICAL EXAMINATION:  GENERAL:  83 y.o.-year-old patient lying in the bed with no acute distress.  Obese EYES: Pupils equal, round, reactive to light and accommodation. No scleral icterus. Extraocular muscles intact.  HEENT: Head atraumatic, normocephalic. Oropharynx and nasopharynx clear.  NECK:  Supple, no jugular venous distention. No thyroid enlargement, no tenderness.  LUNGS: Bilateral wheezing and decreased air entry and crepitations. CARDIOVASCULAR: S1, S2 normal. No murmurs, rubs, or gallops.  ABDOMEN: Soft, nontender, nondistended. Bowel sounds present. No organomegaly or mass.  EXTREMITIES: Chronic venous changes on bilateral lower extremities, no cyanosis, or clubbing.  NEUROLOGIC: Cranial nerves II through XII are intact. Muscle strength generalized weakness in all extremities. Sensation intact. Gait not checked.  PSYCHIATRIC: The patient  is alert and oriented x 3.  SKIN: Bilateral lower extremity thickened skin  LABORATORY PANEL:   CBC Recent Labs  Lab 12/15/17 0528  WBC 8.7  HGB 8.6*  HCT 26.5*  PLT 112*   ------------------------------------------------------------------------------------------------------------------  Chemistries  Recent Labs  Lab 12/17/17 0433  NA 140  K 4.3  CL 102  CO2 29  GLUCOSE 141*  BUN 82*  CREATININE 2.55*  CALCIUM 8.2*  MG 2.2   ------------------------------------------------------------------------------------------------------------------  Cardiac Enzymes No results for input(s): TROPONINI in the last 168 hours. ------------------------------------------------------------------------------------------------------------------  RADIOLOGY:  No results found.  EKG:   Orders placed or performed during the hospital encounter of 12/11/17  . ED EKG  . ED EKG    ASSESSMENT AND PLAN:    * Acute on chronic diastolic congestive heart failure - Lasix held due to worsening renal function- restarted now. - In take and Output and daily weight monitoring - Monitor Bun/Cr and Potassium - Echo -from October 2018 ejection fraction 65-70%, normal overall wall motion. - Cardiology follow up after discharge - giving one more dose of lasix today.  *Acute COPD exacerbation -Decreased dose of prednisone - Nebulizers, Inhalers -Wean O2 as tolerated - Influenza negative  *Diabetes mellitus, uncontrolled with hyperglycemia due to steroids.  Sliding scale insulin.  Steroid stoppep.  *Acute kidney injury over CKD stage III.  With hyperkalemia   Hold Lasix and potassium supplements Creatinine baseline seems to be at 1.8, during this admission  2.38-2.46 -2.85- 2.69-2.47- 2.35 Repeat labs in the morning.  Monitor input and output. Nephrology consulted.  Appreciate input. Now resume lasix and monitor.  *DVT prophylaxis with heparin subcutaneous  Discharge in 1-2 days to  skilled nursing facility.   Cont PT  eval.  All the records are reviewed and case discussed with Care Management/Social Workerr. Management plans discussed with the patient, family and they are in agreement.  CODE STATUS: Partial code.  No intubation but okay to be resuscitated.  TOTAL TIME TAKING CARE OF THIS PATIENT: 35 minutes.   POSSIBLE D/C IN 1-2 DAYS, DEPENDING ON CLINICAL CONDITION.  Note: This dictation was prepared with Dragon dictation along with smaller phrase technology. Any transcriptional errors that result from this process are unintentional.   Vaughan Basta M.D on 12/18/2017 at 9:12 AM  Between 7am to 6pm - Pager - [phone] After 6pm go to [email or web address] - password [email or web address] [facility]  [facility]  Office  [phone]  CC: Primary care physician; Glendon Axe, MD

## 2017-12-18 NOTE — Progress Notes (Signed)
[facility], [place] 12/18/17  Subjective:   Serum creatinine has improved slightly today to 2.17 Urine output 1150 cc Patient reports increased urine output after IV Lasix yesterday He still feels he has a cough and is not able to produce sputum   Objective:  Vital signs in last 24 hours:  Temp:  [98 F (36.7 C)-98.3 F (36.8 C)] 98 F (36.7 C) (03/20 0900) Pulse Rate:  [31-74] 65 (03/20 0858) Resp:  [18-20] 20 (03/20 0858) BP: (122-160)/(53-76) 132/53 (03/20 0858) SpO2:  [95 %-99 %] 96 % (03/20 0858)  Weight change:  Filed Weights   12/14/17 0316 12/15/17 0638 12/16/17 0500  Weight: 133.4 kg (294 lb) (!) 143 kg (315 lb 4.8 oz) (!) 143.8 kg (317 lb)    Intake/Output:    Intake/Output Summary (Last 24 hours) at 12/18/2017 1054 Last data filed at 12/18/2017 0956 Gross per 24 hour  Intake 480 ml  Output 1350 ml  Net -870 ml     Physical Exam: General:  Sitting up in the chair, no acute distress  HEENT  anicteric, moist oral mucous membranes, decreased hearing  Neck  supple   Pulm/lungs  Mild basilar crackles b/l  CVS/Heart  irregular rhythm  Abdomen:   Soft  Extremities:  2-3+ edema, lymphedema  Neurologic:  Alert, oriented,  Skin: Hypertrophic nodular changes from chronic edema          Basic Metabolic Panel:  Recent Labs  Lab 12/14/17 0444 12/14/17 0946 12/15/17 0528 12/16/17 0519 12/17/17 0433 12/18/17 0934  NA 143  --  144 142 140 141  K 5.2*  --  4.3 3.9 4.3 4.1  CL 105  --  104 104 102 103  CO2 29  --  30 30 29 30   GLUCOSE 138*  --  160* 139* 141* 152*  BUN 65*  --  71* 72* 82* 84*  CREATININE 2.69*  --  2.47* 2.35* 2.55* 2.17*  CALCIUM 8.5*  --  8.3* 8.3* 8.2* 8.3*  MG  --   --   --   --  2.2  --   PHOS  --  4.1  --   --   --   --      CBC: Recent Labs  Lab 12/12/17 0529 12/15/17 0528  WBC 6.3 8.7  HGB 8.4* 8.6*  HCT 26.3* 26.5*  MCV 86.3 86.2  PLT 117* 112*      Lab Results  Component Value Date    HEPBSAG Negative 12/14/2017   HEPBSAB Non Reactive 12/14/2017   HEPBIGM Negative 12/14/2017      Microbiology:  No results found for this or any previous visit (from the past 240 hour(s)).  Coagulation Studies: No results for input(s): LABPROT, INR in the last 72 hours.  Urinalysis: No results for input(s): COLORURINE, LABSPEC, PHURINE, GLUCOSEU, HGBUR, BILIRUBINUR, KETONESUR, PROTEINUR, UROBILINOGEN, NITRITE, LEUKOCYTESUR in the last 72 hours.  Invalid input(s): APPERANCEUR    Imaging: No results found.   Medications:    . aspirin EC  81 mg Oral Daily  . chlorhexidine  15 mL Mouth Rinse BID  . finasteride  5 mg Oral Daily  . furosemide  40 mg Intravenous Once  . heparin  5,000 Units Subcutaneous Q8H  . insulin aspart  0-15 Units Subcutaneous TID WC  . insulin aspart  0-5 Units Subcutaneous QHS  . ipratropium-albuterol  3 mL Nebulization Q6H  . mouth rinse  15 mL Mouth Rinse q12n4p  . metoprolol tartrate  12.5 mg  Oral BID  . mirtazapine  15 mg Oral QHS  . pantoprazole  40 mg Oral Daily  . potassium chloride  10 mEq Oral BID  . protein supplement shake  11 oz Oral BID BM  . senna-docusate  1 tablet Oral BID  . sodium chloride flush  3 mL Intravenous Q12H  . tamsulosin  0.4 mg Oral QHS  . [START ON 12/19/2017] torsemide  40 mg Oral Daily   acetaminophen **OR** acetaminophen, albuterol, calcium carbonate, guaiFENesin-codeine, hydrALAZINE, ondansetron **OR** ondansetron (ZOFRAN) IV, polyethylene glycol, sodium chloride flush  Assessment/ Plan:  83 y.o. male with congestive heart failure, lymphedema, COPD, hypertension, diabetes mellitus type II, morbid obesity,who was admitted to [facility] on[facility]/13/2019for acute exacerbation of congestive heart failure  1. Acute renal failure with hyperkalemia on chronic kidney disease stage III with proteinuria: baseline creatinine of 1.88, GFR of 32, on 10/25/17.  Chronic kidney disease secondary to hypertension and  diabetes Ultrasound without obstruction Continue to monitor renal function closely  2. LE edema and lymphedema.  2D echo from October 2018 shows EF 65-70% - Good response to IV Lasix yesterday. - Give 1 dose of IV Lasix and change oral furosemide to torsemide   3.  HTN - d/c amlodipine as it may be contributing to the LE edema - continue with low dose metoprolol    LOS: 7 James Collins 3/20/201910:54 AM  [place], [place]  Note: This note was prepared with Dragon dictation. Any transcription errors are unintentional

## 2017-12-19 ENCOUNTER — Ambulatory Visit: Payer: Medicare Other | Admitting: Family

## 2017-12-19 ENCOUNTER — Encounter: Payer: Self-pay | Admitting: *Deleted

## 2017-12-19 LAB — BASIC METABOLIC PANEL
Anion gap: 8 (ref 5–15)
BUN: 92 mg/dL — AB (ref 6–20)
CALCIUM: 8.2 mg/dL — AB (ref 8.9–10.3)
CHLORIDE: 104 mmol/L (ref 101–111)
CO2: 29 mmol/L (ref 22–32)
CREATININE: 2.44 mg/dL — AB (ref 0.61–1.24)
GFR calc Af Amer: 27 mL/min — ABNORMAL LOW (ref >60)
GFR calc non Af Amer: 23 mL/min — ABNORMAL LOW (ref >60)
Glucose, Bld: 130 mg/dL — ABNORMAL HIGH (ref 65–99)
Potassium: 4.2 mmol/L (ref 3.5–5.1)
SODIUM: 141 mmol/L (ref 135–145)

## 2017-12-19 LAB — GLUCOSE, CAPILLARY
Glucose-Capillary: 129 mg/dL — ABNORMAL HIGH (ref 65–99)
Glucose-Capillary: 151 mg/dL — ABNORMAL HIGH (ref 65–99)

## 2017-12-19 MED ORDER — IPRATROPIUM-ALBUTEROL 0.5-2.5 (3) MG/3ML IN SOLN: 3.0000 mL | Freq: Four times a day (QID) | RESPIRATORY_TRACT | 0 refills | Status: AC

## 2017-12-19 MED ORDER — SENNOSIDES-DOCUSATE SODIUM 8.6-50 MG PO TABS: 1.0000 | ORAL_TABLET | Freq: Two times a day (BID) | ORAL | 0 refills | Status: AC

## 2017-12-19 MED ORDER — POLYETHYLENE GLYCOL 3350 17 G PO PACK: 17.0000 g | PACK | Freq: Every day | ORAL | 0 refills | Status: AC | PRN

## 2017-12-19 MED ORDER — ONDANSETRON HCL 4 MG PO TABS: 4.0000 mg | ORAL_TABLET | Freq: Four times a day (QID) | ORAL | 0 refills | Status: AC | PRN

## 2017-12-19 MED ORDER — GUAIFENESIN-CODEINE 100-10 MG/5ML PO SOLN: 5.0000 mL | ORAL | 0 refills | Status: AC | PRN

## 2017-12-19 MED ORDER — TORSEMIDE 20 MG PO TABS
40.0000 mg | ORAL_TABLET | Freq: Every day | ORAL | 0 refills | Status: AC
Start: 2017-12-20 — End: ?

## 2017-12-19 MED ORDER — METOPROLOL TARTRATE 25 MG PO TABS: 12.5000 mg | ORAL_TABLET | Freq: Two times a day (BID) | ORAL | 0 refills | Status: AC

## 2017-12-19 MED ORDER — ALBUTEROL SULFATE (2.5 MG/3ML) 0.083% IN NEBU: 2.5000 mg | INHALATION_SOLUTION | RESPIRATORY_TRACT | 12 refills | Status: AC | PRN

## 2017-12-19 MED ORDER — PREMIER PROTEIN SHAKE: 11.0000 [oz_av] | Freq: Two times a day (BID) | ORAL | 0 refills | Status: AC

## 2017-12-19 NOTE — Progress Notes (Signed)
[facility], [place] 12/19/17  Subjective:   Serum creatinine continues to fluctuate and is slightly worse today Good response to iv lasix; Urine output 2000 cc He still feels he has a cough and is not able to produce sputum Able to eat good without any nausea or vomiting  Objective:  Vital signs in last 24 hours:  Temp:  [97.8 F (36.6 C)-98.4 F (36.9 C)] 98.4 F (36.9 C) (03/21 1208) Pulse Rate:  [59-79] 63 (03/21 1208) Resp:  [18-20] 18 (03/21 1208) BP: (114-127)/(45-86) 126/56 (03/21 1208) SpO2:  [93 %-98 %] 98 % (03/21 1208)  Weight change:  Filed Weights   12/14/17 0316 12/15/17 0638 12/16/17 0500  Weight: 133.4 kg (294 lb) (!) 143 kg (315 lb 4.8 oz) (!) 143.8 kg (317 lb)    Intake/Output:    Intake/Output Summary (Last 24 hours) at 12/19/2017 1214 Last data filed at 12/19/2017 1010 Gross per 24 hour  Intake 966 ml  Output 1701 ml  Net -735 ml     Physical Exam: General:  Sitting up in the chair, no acute distress  HEENT  anicteric, moist oral mucous membranes, decreased hearing  Neck  supple   Pulm/lungs  Mild basilar crackles b/l  CVS/Heart  irregular rhythm  Abdomen:   Soft  Extremities:  2-3+ edema, lymphedema  Neurologic:  Alert, oriented,  Skin: Hypertrophic nodular changes from chronic edema          Basic Metabolic Panel:  Recent Labs  Lab 12/14/17 0946 12/15/17 0528 12/16/17 0519 12/17/17 0433 12/18/17 0934 12/19/17 0524  NA  --  144 142 140 141 141  K  --  4.3 3.9 4.3 4.1 4.2  CL  --  104 104 102 103 104  CO2  --  30 30 29 30 29   GLUCOSE  --  160* 139* 141* 152* 130*  BUN  --  71* 72* 82* 84* 92*  CREATININE  --  2.47* 2.35* 2.55* 2.17* 2.44*  CALCIUM  --  8.3* 8.3* 8.2* 8.3* 8.2*  MG  --   --   --  2.2  --   --   PHOS 4.1  --   --   --   --   --      CBC: Recent Labs  Lab 12/15/17 0528  WBC 8.7  HGB 8.6*  HCT 26.5*  MCV 86.2  PLT 112*      Lab Results  Component Value Date   HEPBSAG  Negative 12/14/2017   HEPBSAB Non Reactive 12/14/2017   HEPBIGM Negative 12/14/2017      Microbiology:  No results found for this or any previous visit (from the past 240 hour(s)).  Coagulation Studies: No results for input(s): LABPROT, INR in the last 72 hours.  Urinalysis: No results for input(s): COLORURINE, LABSPEC, PHURINE, GLUCOSEU, HGBUR, BILIRUBINUR, KETONESUR, PROTEINUR, UROBILINOGEN, NITRITE, LEUKOCYTESUR in the last 72 hours.  Invalid input(s): APPERANCEUR    Imaging: Dg Chest Port 1 View  Result Date: 12/18/2017 CLINICAL DATA:  Hypoxia EXAM: PORTABLE CHEST 1 VIEW COMPARISON:  12/15/2017 FINDINGS: Cardiac shadow remains enlarged but stable. The lungs are well aerated without focal infiltrate or sizable effusion. The previously seen pulmonary edema has resolved in the interval. No sizable effusion is seen. No bony abnormality is noted. IMPRESSION: Resolution of previously seen pulmonary edema. Electronically Signed   By: Inez Catalina M.D.   On: 12/18/2017 13:23     Medications:    . aspirin EC  81 mg Oral  Daily  . chlorhexidine  15 mL Mouth Rinse BID  . finasteride  5 mg Oral Daily  . heparin  5,000 Units Subcutaneous Q8H  . insulin aspart  0-15 Units Subcutaneous TID WC  . insulin aspart  0-5 Units Subcutaneous QHS  . ipratropium-albuterol  3 mL Nebulization Q6H  . mouth rinse  15 mL Mouth Rinse q12n4p  . metoprolol tartrate  12.5 mg Oral BID  . mirtazapine  15 mg Oral QHS  . pantoprazole  40 mg Oral Daily  . potassium chloride  10 mEq Oral BID  . protein supplement shake  11 oz Oral BID BM  . senna-docusate  1 tablet Oral BID  . sodium chloride flush  3 mL Intravenous Q12H  . tamsulosin  0.4 mg Oral QHS  . torsemide  40 mg Oral Daily   acetaminophen **OR** acetaminophen, albuterol, calcium carbonate, guaiFENesin-codeine, hydrALAZINE, ondansetron **OR** ondansetron (ZOFRAN) IV, polyethylene glycol, sodium chloride flush  Assessment/ Plan:  83 y.o. male  with congestive heart failure, lymphedema, COPD, hypertension, diabetes mellitus type II, morbid obesity,who was admitted to [facility] on3/13/2019for acute exacerbation of congestive heart failure  1. Acute renal failure with hyperkalemia on chronic kidney disease stage III with proteinuria: baseline creatinine of 1.88, GFR of 32, on 10/25/17.  Chronic kidney disease secondary to hypertension and diabetes. GFR now stabilizing in low 20's Ultrasound without obstruction Continue to monitor renal function closely  2. LE edema and lymphedema.  2D echo from October 2018 shows EF 65-70% - continue oral torsemide   3.  HTN - d/c amlodipine as it may be contributing to the LE edema - continue with low dose metoprolol  Patient states he is not able to walk D/c planning to SNF for rehab    LOS: [place] 3/21/201912:14 PM  [place], [place]  Note: This note was prepared with Dragon dictation. Any transcription errors are unintentional

## 2017-12-19 NOTE — Clinical Social Work Note (Signed)
CSW spoke with patient's son [name], [phone], and he is aware that patient will be discharging to [facility] today.  Patient to be d/c'ed today to [facility].  Patient and family agreeable to plans will transport via ems RN to call report to room 5A, [phone].  Evette Cristal, MSW, [place]

## 2017-12-19 NOTE — Progress Notes (Signed)
[place] at [place] NAME: James Collins    MR#:  [id number]  DATE OF BIRTH:  Aug 28, 1935  SUBJECTIVE:   Hard of hearing  Sitting in chair, feels some better.  had good diuresis with IV lasix, renal func stable. Still have some cough and no sputum.  REVIEW OF SYSTEMS:  CONSTITUTIONAL: Fatigue EYES: No blurred or double vision.  EARS, NOSE, AND THROAT: No tinnitus or ear pain.  RESPIRATORY: Has cough and shortness of breath. CARDIOVASCULAR: No chest pain, orthopnea, edema.  GASTROINTESTINAL: No nausea, vomiting, diarrhea or abdominal pain.  GENITOURINARY: No dysuria, hematuria.  ENDOCRINE: No polyuria, nocturia,  HEMATOLOGY: No anemia, easy bruising or bleeding SKIN: No rash or lesion. MUSCULOSKELETAL: No joint pain or arthritis.   NEUROLOGIC: No tingling, numbness, weakness.  PSYCHIATRY: No anxiety or depression.   DRUG ALLERGIES:   Allergies  Allergen Reactions  . Penicillins     VITALS:  Blood pressure (!) 116/49, pulse 79, temperature 97.8 F (36.6 C), temperature source Oral, resp. rate 20, height 5\' 7"  (1.702 m), weight (!) 143.8 kg (317 lb), SpO2 94 %.  PHYSICAL EXAMINATION:  GENERAL:  83 y.o.-year-old patient lying in the bed with no acute distress.  Obese EYES: Pupils equal, round, reactive to light and accommodation. No scleral icterus. Extraocular muscles intact.  HEENT: Head atraumatic, normocephalic. Oropharynx and nasopharynx clear.  NECK:  Supple, no jugular venous distention. No thyroid enlargement, no tenderness.  LUNGS: Bilateral wheezing and decreased air entry and crepitations. CARDIOVASCULAR: S1, S2 normal. No murmurs, rubs, or gallops.  ABDOMEN: Soft, nontender, nondistended. Bowel sounds present. No organomegaly or mass.  EXTREMITIES: Chronic venous changes on bilateral lower extremities, no cyanosis, or clubbing.  NEUROLOGIC: Cranial nerves II through XII are intact. Muscle strength generalized  weakness in all extremities. Sensation intact. Gait not checked.  PSYCHIATRIC: The patient is alert and oriented x 3.  SKIN: Bilateral lower extremity thickened skin  LABORATORY PANEL:   CBC Recent Labs  Lab 12/15/17 0528  WBC 8.7  HGB 8.6*  HCT 26.5*  PLT 112*   ------------------------------------------------------------------------------------------------------------------  Chemistries  Recent Labs  Lab 12/17/17 0433  12/19/17 0524  NA 140   < > 141  K 4.3   < > 4.2  CL 102   < > 104  CO2 29   < > 29  GLUCOSE 141*   < > 130*  BUN 82*   < > 92*  CREATININE 2.55*   < > 2.44*  CALCIUM 8.2*   < > 8.2*  MG 2.2  --   --    < > = values in this interval not displayed.   ------------------------------------------------------------------------------------------------------------------  Cardiac Enzymes No results for input(s): TROPONINI in the last 168 hours. ------------------------------------------------------------------------------------------------------------------  RADIOLOGY:  Dg Chest Port 1 View  Result Date: 12/18/2017 CLINICAL DATA:  Hypoxia EXAM: PORTABLE CHEST 1 VIEW COMPARISON:  12/15/2017 FINDINGS: Cardiac shadow remains enlarged but stable. The lungs are well aerated without focal infiltrate or sizable effusion. The previously seen pulmonary edema has resolved in the interval. No sizable effusion is seen. No bony abnormality is noted. IMPRESSION: Resolution of previously seen pulmonary edema. Electronically Signed   By: Inez Catalina M.D.   On: 12/18/2017 13:23    EKG:   Orders placed or performed during the hospital encounter of 12/11/17  . ED EKG  . ED EKG    ASSESSMENT AND PLAN:    * Acute on chronic diastolic congestive heart failure -  Lasix held due to worsening renal function- restarted now. - In take and Output and daily weight monitoring- Had good diuresis. - Monitor Bun/Cr and Potassium - Echo -from October 2018 ejection fraction 65-70%,  normal overall wall motion. - Cardiology follow up after discharge - giving one more dose of IV lasix today.  *Acute COPD exacerbation -Decreased dose of prednisone - Nebulizers, Inhalers -Wean O2 as tolerated - Influenza negative  *Diabetes mellitus, uncontrolled with hyperglycemia due to steroids.  Sliding scale insulin.  Steroid stoppep.  *Acute kidney injury over CKD stage III.  With hyperkalemia   Hold Lasix and potassium supplements Creatinine baseline seems to be at 1.8, during this admission  2.38-2.46 -2.85- 2.69-2.47- 2.35 Repeat labs in the morning.  Monitor input and output. Nephrology consulted.  Appreciate input. Now resume lasix and monitor.  * Htn    May benefit by stopping amlodipine/  *DVT prophylaxis with heparin subcutaneous  Discharge in 1-2 days to skilled nursing facility.   Cont PT eval.  All the records are reviewed and case discussed with Care Management/Social Workerr. Management plans discussed with the patient, family and they are in agreement.  CODE STATUS: Partial code.  No intubation but okay to be resuscitated.  TOTAL TIME TAKING CARE OF THIS PATIENT: 35 minutes.   POSSIBLE D/C IN 1-2 DAYS, DEPENDING ON CLINICAL CONDITION.  Note: This dictation was prepared with Dragon dictation along with smaller phrase technology. Any transcriptional errors that result from this process are unintentional.   Vaughan Basta M.D on 12/19/2017 at 8:58 AM  Between 7am to 6pm - Pager - [phone] After 6pm go to [email or web address] - password [email or web address] [facility]  [facility]  Office  [phone]  CC: Primary care physician; Glendon Axe, MD

## 2017-12-19 NOTE — Progress Notes (Addendum)
Nutrition Brief Note  Patient identified for LOS  82 y.o. male with congestive heart failure, lymphedema, COPD, hypertension, diabetes mellitus type II, morbid obesity,who was admitted to [facility] on3/13/2019foracute exacerbation of congestive heart failure  Wt Readings from Last 15 Encounters:  12/16/17 (!) 317 lb (143.8 kg)  08/13/17 300 lb (136.1 kg)  07/29/17 283 lb (128.4 kg)  07/11/17 298 lb 1 oz (135.2 kg)  02/19/16 266 lb 15.6 oz (121.1 kg)    Body mass index is 49.65 kg/m. Patient meets criteria for morbid obese based on current BMI.   Current diet order is , patient is consuming approximately 100% of meals at this time. Labs and medications reviewed.   RD with met with pt on 3/15 and provided CHF education. Pt ordered for Premier Protein BID at that time, each supplement provides 160 kcal and 30 grams of protein.   No nutrition interventions warranted at this time. If nutrition issues arise, please consult RD.   Casey Campbell MS, RD, LDN Pager #- [phone] After Hours Pager: [phone]  

## 2017-12-19 NOTE — Clinical Social Work Placement (Signed)
   CLINICAL SOCIAL WORK PLACEMENT  NOTE  Date:  12/19/2017  Patient Details  Name: James Collins MRN: [id number] Date of Birth: [birth year]  Clinical Social Work is seeking post-discharge placement for this patient at the [place] level of care (*CSW will initial, date and re-position this form in  chart as items are completed):  Yes   Patient/family provided with [facility] Work Department's list of facilities offering this level of care within the geographic area requested by the patient (or if unable, by the patient's family).  Yes   Patient/family informed of their freedom to choose among providers that offer the needed level of care, that participate in Medicare, Medicaid or managed care program needed by the patient, have an available bed and are willing to accept the patient.  Yes   Patient/family informed of [facility]'s ownership interest in [facility] and [facility], as well as of the fact that they are under no obligation to receive care at these facilities.  PASRR submitted to EDS on 12/15/17     PASRR number received on 12/15/17     Existing PASRR number confirmed on       FL2 transmitted to all facilities in geographic area requested by pt/family on 12/15/17     FL2 transmitted to all facilities within larger geographic area on       Patient informed that his/her managed care company has contracts with or will negotiate with certain facilities, including the following:        Yes   Patient/family informed of bed offers received.  Patient chooses bed at [facility]     Physician recommends and patient chooses bed at      Patient to be transferred to [facility] on 12/19/17.  Patient to be transferred to facility by [facility]     Patient family notified on 12/19/17 of transfer.  Name of family member notified:  Delton See, patient' son [phone]     PHYSICIAN Please sign FL2      Additional Comment:    _______________________________________________ Ross Ludwig, LCSWA 12/19/2017, 11:15 AM

## 2017-12-19 NOTE — Plan of Care (Signed)
  Problem: Education: Goal: Ability to demonstrate management of disease process will improve Outcome: Adequate for Discharge Goal: Ability to verbalize understanding of medication therapies will improve Outcome: Adequate for Discharge   Problem: Activity: Goal: Capacity to carry out activities will improve Outcome: Adequate for Discharge   Problem: Cardiac: Goal: Ability to achieve and maintain adequate cardiopulmonary perfusion will improve Outcome: Adequate for Discharge   Problem: Education: Goal: Knowledge of General Education information will improve Outcome: Adequate for Discharge   Problem: Health Behavior/Discharge Planning: Goal: Ability to manage health-related needs will improve Outcome: Adequate for Discharge   Problem: Clinical Measurements: Goal: Ability to maintain clinical measurements within normal limits will improve Outcome: Adequate for Discharge Goal: Will remain free from infection Outcome: Adequate for Discharge Goal: Diagnostic test results will improve Outcome: Adequate for Discharge Goal: Respiratory complications will improve Outcome: Adequate for Discharge Goal: Cardiovascular complication will be avoided Outcome: Adequate for Discharge   Problem: Activity: Goal: Risk for activity intolerance will decrease Outcome: Adequate for Discharge   Problem: Nutrition: Goal: Adequate nutrition will be maintained Outcome: Adequate for Discharge   Problem: Safety: Goal: Ability to remain free from injury will improve Outcome: Adequate for Discharge   Problem: Skin Integrity: Goal: Risk for impaired skin integrity will decrease Outcome: Adequate for Discharge

## 2017-12-19 NOTE — Discharge Summary (Signed)
[place] at [place] NAME: James Collins    MR#:  [id number]  DATE OF BIRTH:  [birth year]  DATE OF ADMISSION:  12/11/2017 ADMITTING PHYSICIAN: Hillary Bow, MD  DATE OF DISCHARGE: 12/19/2017   PRIMARY CARE PHYSICIAN: Glendon Axe, MD    ADMISSION DIAGNOSIS:  Acute kidney injury superimposed on chronic kidney disease ([place]) [N17.9, N18.9] Acute on chronic congestive heart failure, unspecified heart failure type ([place]) [I50.9] Acute on chronic anemia [D64.9]  DISCHARGE DIAGNOSIS:  Active Problems:   CHF (congestive heart failure) ([place])   SECONDARY DIAGNOSIS:   Past Medical History:  Diagnosis Date  . CHF (congestive heart failure) ([place])   . Chronic kidney disease   . COPD (chronic obstructive pulmonary disease) ([place])   . Diabetes mellitus without complication ([place])   . Hypertension   . Lymphedema     HOSPITAL COURSE:   *Acute on chronic diastolic congestive heart failure - Lasix held due to worsening renal function- restarted now. - In take and Output and daily weight monitoring- Had good diuresis. - Monitor Bun/Cr and Potassium - Echo-from October 2018 ejection fraction 65-70%, normal overall wall motion. - Cardiology follow up after discharge - starting oral torsemide.  *Acute COPD exacerbation -Decreased dose of prednisone - Nebulizers, Inhalers -Wean O2 as tolerated - Influenza negative  *Diabetes mellitus, uncontrolled with hyperglycemia due to steroids.  Sliding scale insulin.  Steroid stoppep.  *Acute kidney injury over CKD stage III.  With hyperkalemia  Hold Lasix and potassium supplements Creatinine baseline seems to be at 1.8, during this admission  2.38-2.46 -2.85- 2.69-2.47- 2.35 Repeat labs in the morning. Monitor input and output. Nephrology consulted.  Appreciate input. Now resume lasix and monitor.  * Htn    May benefit by stopping amlodipine/   Small dose metoprolol.  *DVT  prophylaxis with heparin subcutaneous    DISCHARGE CONDITIONS:   Stable.  CONSULTS OBTAINED:  Treatment Team:  Lavonia Dana, MD  DRUG ALLERGIES:   Allergies  Allergen Reactions  . Penicillins     DISCHARGE MEDICATIONS:   Allergies as of 12/19/2017      Reactions   Penicillins      Allergies as of 12/19/2017      Reactions   Penicillins       Medication List    STOP taking these medications   amLODipine 5 MG tablet Commonly known as:  NORVASC   benazepril 20 MG tablet Commonly known as:  LOTENSIN   furosemide 20 MG tablet Commonly known as:  LASIX     TAKE these medications   albuterol (2.5 MG/3ML) 0.083% nebulizer solution Commonly known as:  PROVENTIL Take 3 mLs (2.5 mg total) by nebulization every 2 (two) hours as needed for wheezing.   aspirin EC 81 MG tablet Take 81 mg by mouth daily.   calcium carbonate 500 MG chewable tablet Commonly known as:  TUMS - dosed in mg elemental calcium Chew 1 tablet by mouth 3 (three) times daily as needed for indigestion or heartburn.   finasteride 5 MG tablet Commonly known as:  PROSCAR Take 5 mg by mouth daily.   guaiFENesin-codeine 100-10 MG/5ML syrup Take 5 mLs by mouth every 4 (four) hours as needed for cough.   ipratropium-albuterol 0.5-2.5 (3) MG/3ML Soln Commonly known as:  DUONEB Take 3 mLs by nebulization every 6 (six) hours.   loratadine 10 MG tablet Commonly known as:  CLARITIN Take 1 tablet by mouth daily as needed for allergies.   metoprolol  tartrate 25 MG tablet Commonly known as:  LOPRESSOR Take 0.5 tablets (12.5 mg total) by mouth 2 (two) times daily.   omeprazole 40 MG capsule Commonly known as:  PRILOSEC Take 40 mg by mouth daily.   ondansetron 4 MG tablet Commonly known as:  ZOFRAN Take 1 tablet (4 mg total) by mouth every 6 (six) hours as needed for nausea.   polyethylene glycol packet Commonly known as:  MIRALAX / GLYCOLAX Take 17 g by mouth daily as needed for mild  constipation.   potassium chloride SA 20 MEQ tablet Commonly known as:  K-DUR,KLOR-CON Take 1 tablet (20 mEq total) by mouth daily.   protein supplement shake Liqd Commonly known as:  PREMIER PROTEIN Take 325 mLs (11 oz total) by mouth 2 (two) times daily between meals.   senna-docusate 8.6-50 MG tablet Commonly known as:  Senokot-S Take 1 tablet by mouth 2 (two) times daily.   tamsulosin 0.4 MG Caps capsule Commonly known as:  FLOMAX Take 0.4 mg by mouth at bedtime.   torsemide 20 MG tablet Commonly known as:  DEMADEX Take 2 tablets (40 mg total) by mouth daily. Start taking on:  12/20/2017   vitamin B-12 1000 MCG tablet Commonly known as:  CYANOCOBALAMIN Take 1,000 mcg by mouth daily.        DISCHARGE INSTRUCTIONS:    Follow with PMD in 6 weeks.  If you experience worsening of your admission symptoms, develop shortness of breath, life threatening emergency, suicidal or homicidal thoughts you must seek medical attention immediately by calling 911 or calling your MD immediately  if symptoms less severe.  You Must read complete instructions/literature along with all the possible adverse reactions/side effects for all the Medicines you take and that have been prescribed to you. Take any new Medicines after you have completely understood and accept all the possible adverse reactions/side effects.   Please note  You were cared for by a hospitalist during your hospital stay. If you have any questions about your discharge medications or the care you received while you were in the hospital after you are discharged, you can call the unit and asked to speak with the hospitalist on call if the hospitalist that took care of you is not available. Once you are discharged, your primary care physician will handle any further medical issues. Please note that NO REFILLS for any discharge medications will be authorized once you are discharged, as it is imperative that you return to your  primary care physician (or establish a relationship with a primary care physician if you do not have one) for your aftercare needs so that they can reassess your need for medications and monitor your lab values.    Today   CHIEF COMPLAINT:   Chief Complaint  Patient presents with  . Cough  . Nasal Congestion  . Groin Swelling    HISTORY OF PRESENT ILLNESS:  James Collins  is a 83 y.o. male with a known history of diastolic CHF, COPD, CKD stage III, diabetes, hypertension presents to the hospital complaining of worsening lower extremity swelling, weakness and shortness of breath over the last 3 days.  Patient has chronic shortness of breath, lower extremity swelling.  He has been having progressive weakness over the last 6 months.  He has significantly deteriorated over the last 3 days to the point that he was unable to ambulate on his own.  He has been ambulating with a walker at home over the past few months. Here patient has been  found to have influenza negative, chest x-ray showing pulmonary edema.  Creatinine is worse than baseline.   VITAL SIGNS:  Blood pressure (!) 116/49, pulse 79, temperature 97.8 F (36.6 C), temperature source Oral, resp. rate 20, height 5\' 7"  (1.702 m), weight (!) 143.8 kg (317 lb), SpO2 94 %.  I/O:    Intake/Output Summary (Last 24 hours) at 12/19/2017 0953 Last data filed at 12/19/2017 0852 Gross per 24 hour  Intake 966 ml  Output 1901 ml  Net -935 ml    PHYSICAL EXAMINATION:   GENERAL:  83 y.o.-year-old patient lying in the bed with no acute distress.  Obese EYES: Pupils equal, round, reactive to light and accommodation. No scleral icterus. Extraocular muscles intact.  HEENT: Head atraumatic, normocephalic. Oropharynx and nasopharynx clear.  NECK:  Supple, no jugular venous distention. No thyroid enlargement, no tenderness.  LUNGS: Bilateral wheezing and decreased air entry and crepitations. CARDIOVASCULAR: S1, S2 normal. No murmurs, rubs, or  gallops.  ABDOMEN: Soft, nontender, nondistended. Bowel sounds present. No organomegaly or mass.  EXTREMITIES: Chronic venous changes on bilateral lower extremities, no cyanosis, or clubbing.  NEUROLOGIC: Cranial nerves II through XII are intact. Muscle strength generalized weakness in all extremities. Sensation intact. Gait not checked.  PSYCHIATRIC: The patient is alert and oriented x 3.  SKIN: Bilateral lower extremity thickened skin    DATA REVIEW:   CBC Recent Labs  Lab 12/15/17 0528  WBC 8.7  HGB 8.6*  HCT 26.5*  PLT 112*    Chemistries  Recent Labs  Lab 12/17/17 0433  12/19/17 0524  NA 140   < > 141  K 4.3   < > 4.2  CL 102   < > 104  CO2 29   < > 29  GLUCOSE 141*   < > 130*  BUN 82*   < > 92*  CREATININE 2.55*   < > 2.44*  CALCIUM 8.2*   < > 8.2*  MG 2.2  --   --    < > = values in this interval not displayed.    Cardiac Enzymes No results for input(s): TROPONINI in the last 168 hours.  Microbiology Results  Results for orders placed or performed during the hospital encounter of 08/13/17  Urine Culture     Status: Abnormal   Collection Time: 08/13/17  2:22 PM  Result Value Ref Range Status   Specimen Description URINE, RANDOM  Final   Special Requests NONE  Final   Culture MULTIPLE SPECIES PRESENT, SUGGEST RECOLLECTION (A)  Final   Report Status 08/15/2017 FINAL  Final    RADIOLOGY:  Dg Chest Port 1 View  Result Date: 12/18/2017 CLINICAL DATA:  Hypoxia EXAM: PORTABLE CHEST 1 VIEW COMPARISON:  12/15/2017 FINDINGS: Cardiac shadow remains enlarged but stable. The lungs are well aerated without focal infiltrate or sizable effusion. The previously seen pulmonary edema has resolved in the interval. No sizable effusion is seen. No bony abnormality is noted. IMPRESSION: Resolution of previously seen pulmonary edema. Electronically Signed   By: Inez Catalina M.D.   On: 12/18/2017 13:23    EKG:   Orders placed or performed during the hospital encounter of  12/11/17  . ED EKG  . ED EKG      Management plans discussed with the patient, family and they are in agreement.  CODE STATUS:     Code Status Orders  (From admission, onward)        Start     Ordered   12/14/17 1242  Limited resuscitation (code)  Continuous    Question Answer Comment  In the event of cardiac or respiratory ARREST: Initiate Code Blue, Call Rapid Response Yes   In the event of cardiac or respiratory ARREST: Perform CPR Yes   In the event of cardiac or respiratory ARREST: Perform Intubation/Mechanical Ventilation No   In the event of cardiac or respiratory ARREST: Use NIPPV/BiPAp only if indicated Yes   In the event of cardiac or respiratory ARREST: Administer ACLS medications if indicated Yes   In the event of cardiac or respiratory ARREST: Perform Defibrillation or Cardioversion if indicated Yes      12/14/17 1241    Code Status History    Date Active Date Inactive Code Status Order ID Comments User Context   12/11/2017 2227 12/14/2017 1241 Full Code [id number]  Max Sane, MD Inpatient   12/11/2017 0739 12/11/2017 2227 DNR [id number]  Hillary Bow, MD ED   07/09/2017 1113 07/11/2017 1945 DNR [id number]  Vaughan Basta, MD Inpatient   02/16/2016 1752 02/20/2016 1559 DNR [id number]  Fritzi Mandes, MD Inpatient      TOTAL TIME TAKING CARE OF THIS PATIENT: 35 minutes.    Vaughan Basta M.D on 12/19/2017 at 9:53 AM  Between 7am to 6pm - Pager - [phone]  After 6pm go to [email or web address] - password [email or web address] [facility]  Office  [phone]  CC: Primary care physician; Glendon Axe, MD   Note: This dictation was prepared with Dragon dictation along with smaller phrase technology. Any transcriptional errors that result from this process are unintentional.

## 2017-12-19 NOTE — Progress Notes (Signed)
Patient given discharge instructions with family at bedside. IV taken out and tele monitor off. Family verbalized understanding with no further questions. Patient transported to [facility] via EMS. Vital signs stable at time of transfer. Report given to [facility] at [facility].

## 2017-12-23 NOTE — Progress Notes (Signed)
Patient ID: James Collins, male    DOB: 1935-02-09, 83 y.o.   MRN: [id number]  HPI  James Collins is an 83 y/o male with a history of lymphedema, HTN, CKD and chronic heart failure.   Echo from 07/09/17 reviewed and shows an EF of 65-70%.   Admitted 12/11/17 due to acute on chronic HF exacerbation. Lasix had to be held due to worsening renal function and then diuretics were restarted. Nephrology consult was obtained. Given nebulizers and inhalers. Discharged after 8 days. Was in the ED 10/25/17 due to a mechanical fall after his legs gave out on him. Head CT obtained and he was released. Was in the ED 08/13/17 due to weakness. Patient was evaluated and then released. Admitted 07/09/17 due to acute HF. Initially needed IV diuretics and then transitioned to oral diuretics. Followed at lymphedema clinic for chronic lymphedema. Discharged home after 2 days.  He presents today for a follow-up appointment with a chief complaint of moderate fatigue with little exertion. He says this has been present for several years and does seem to be worse since most recent hospitalization. He has associated edema, shortness of breath, leg weakness and light-headedness along with this. He denies any difficulty sleeping, abdominal distention, palpitations or chest pain. Says that he's getting weighed daily at [facility] and thinks his weight today was 318 pounds. Unable to safely stand on our scale today.   Past Medical History:  Diagnosis Date  . CHF (congestive heart failure) ([place])   . Chronic kidney disease   . COPD (chronic obstructive pulmonary disease) ([place])   . Diabetes mellitus without complication ([place])   . Hypertension   . Lymphedema    Past Surgical History:  Procedure Laterality Date  . CATARACT EXTRACTION, BILATERAL Bilateral   . REPLACEMENT TOTAL KNEE Left    Family History  Problem Relation Age of Onset  . CAD Father    Social History   Tobacco Use  . Smoking status: Never  Smoker  . Smokeless tobacco: Never Used  Substance Use Topics  . Alcohol use: No   Allergies  Allergen Reactions  . Penicillins    Prior to Admission medications   Medication Sig Start Date End Date Taking? Authorizing Provider  albuterol (PROVENTIL) (2.5 MG/3ML) 0.083% nebulizer solution Take 3 mLs (2.5 mg total) by nebulization every 2 (two) hours as needed for wheezing. 12/19/17  Yes Vaughan Basta, MD  aspirin EC 81 MG tablet Take 81 mg by mouth daily.   Yes [provider]  calcium carbonate (TUMS - DOSED IN MG ELEMENTAL CALCIUM) 500 MG chewable tablet Chew 1 tablet by mouth 3 (three) times daily as needed for indigestion or heartburn.   Yes [provider]  finasteride (PROSCAR) 5 MG tablet Take 5 mg by mouth daily.    Yes [provider]  guaiFENesin-codeine 100-10 MG/5ML syrup Take 5 mLs by mouth every 4 (four) hours as needed for cough. 12/19/17  Yes Vaughan Basta, MD  ipratropium-albuterol (DUONEB) 0.5-2.5 (3) MG/3ML SOLN Take 3 mLs by nebulization every 6 (six) hours. 12/19/17  Yes Vaughan Basta, MD  metoprolol tartrate (LOPRESSOR) 25 MG tablet Take 0.5 tablets (12.5 mg total) by mouth 2 (two) times daily. 12/19/17  Yes Vaughan Basta, MD  omeprazole (PRILOSEC) 40 MG capsule Take 40 mg by mouth daily. 11/20/17  Yes [provider]  ondansetron (ZOFRAN) 4 MG tablet Take 1 tablet (4 mg total) by mouth every 6 (six) hours as needed for nausea. 12/19/17  Yes Vaughan Basta, MD  polyethylene glycol (MIRALAX / GLYCOLAX) packet Take 17 g by mouth daily as needed for mild constipation. 12/19/17  Yes Vaughan Basta, MD  potassium chloride SA (K-DUR,KLOR-CON) 20 MEQ tablet Take 1 tablet (20 mEq total) by mouth daily. 07/11/17  Yes Vaughan Basta, MD  protein supplement shake (PREMIER PROTEIN) LIQD Take 325 mLs (11 oz total) by mouth 2 (two) times daily between meals. 12/19/17  Yes Vaughan Basta, MD   senna-docusate (SENOKOT-S) 8.6-50 MG tablet Take 1 tablet by mouth 2 (two) times daily. 12/19/17  Yes Vaughan Basta, MD  tamsulosin (FLOMAX) 0.4 MG CAPS capsule Take 0.4 mg by mouth at bedtime. 11/20/17  Yes [provider]  torsemide (DEMADEX) 20 MG tablet Take 2 tablets (40 mg total) by mouth daily. 12/20/17  Yes Vaughan Basta, MD  vitamin B-12 (CYANOCOBALAMIN) 1000 MCG tablet Take 1,000 mcg by mouth daily.   Yes [provider]   Review of Systems  Constitutional: Positive for fatigue. Negative for appetite change.  HENT: Positive for hearing loss. Negative for congestion, postnasal drip and sore throat.   Eyes: Negative.   Respiratory: Positive for cough and shortness of breath. Negative for chest tightness and wheezing.   Cardiovascular: Positive for leg swelling. Negative for chest pain and palpitations.  Gastrointestinal: Negative for abdominal distention and abdominal pain.  Endocrine: Negative.   Genitourinary: Negative.   Musculoskeletal: Negative for arthralgias and back pain.  Skin: Negative.   Allergic/Immunologic: Negative.   Neurological: Positive for weakness (in legs) and light-headedness (with sudden position changes). Negative for dizziness.  Hematological: Negative for adenopathy. Does not bruise/bleed easily.  Psychiatric/Behavioral: Negative for dysphoric mood and sleep disturbance (wearing oxygen @ 2L ). The patient is not nervous/anxious.    Vitals:   12/25/17 1031  BP: 133/67  Pulse: 78  Resp: 18  SpO2: 94%  Weight: (!) 318 lb (144.2 kg)  Height: 5\' 8"  (1.727 m)   Wt Readings from Last 3 Encounters:  12/25/17 (!) 318 lb (144.2 kg)  12/16/17 (!) 317 lb (143.8 kg)  08/13/17 300 lb (136.1 kg)   Lab Results  Component Value Date   CREATININE 2.44 (H) 12/19/2017   CREATININE 2.17 (H) 12/18/2017   CREATININE 2.55 (H) 12/17/2017    Physical Exam  Constitutional: He is oriented to person, place, and time. He appears  well-developed and well-nourished.  HENT:  Head: Normocephalic and atraumatic.  Right Ear: Decreased hearing is noted.  Left Ear: Decreased hearing is noted.  Neck: Normal range of motion. Neck supple. No JVD present.  Cardiovascular: Normal rate and regular rhythm.  Pulmonary/Chest: Effort normal. He has no wheezes. He has no rales.  Abdominal: Soft. He exhibits no distension. There is no tenderness.  Musculoskeletal: He exhibits edema (lymphedema present bilateral lower legs). He exhibits no tenderness.  Neurological: He is alert and oriented to person, place, and time.  Skin: Skin is warm and dry.  Psychiatric: He has a normal mood and affect. His behavior is normal. Thought content normal.  Nursing note and vitals reviewed.  Assessment & Plan:  1: Chronic heart failure with preserved ejection fraction- - NYHA class III - euvolemic today - getting weighed daily at [facility]. Reminded staff member to call for an overnight weight gain of >2 pounds or a weekly weight gain of >5 pounds - not adding salt to his food but does note a decreased appetite - drinking ~ 1.5 hospital cups full of fluid daily (~45 ounces) - receiving  PT with trying to build his muscle strength. Says that he can stand for just a short period of time before he feels like his legs are going to give out on him - wearing oxygen at 2L around the clock - PharmD went in and reviewed medications  - BNP 12/11/17 was 362.0  2: HTN- - BP looks good today - saw new PCP Candiss Norse) 11/20/17; now will be seeing PCP at the facility - BMP from 12/19/17 reviewed and shows sodium 141, potassium 4.2 and GFR 23  3: Lymphedema- - does elevate his legs sometimes - has been seen at the wound center in the past - unable to wear TED hose due to the size of his lower legs  Patient did not bring his medications nor a list. Each medication was verbally reviewed with the patient and he was encouraged to bring the bottles to  every visit to confirm accuracy of list.  Will not make a follow-up appointment at this time since patient appears to be stable. Discussed that he may need pulmonology referral if the nebulizer treatment doesn't help enough with his shortness of breath. Staff member with him will relay that information. Did advise them that they could call at anytime in the future to make another appointment if needed.

## 2017-12-25 ENCOUNTER — Ambulatory Visit: Payer: No Typology Code available for payment source | Attending: Family | Admitting: Family

## 2017-12-25 ENCOUNTER — Encounter: Payer: Self-pay | Admitting: Family

## 2017-12-25 VITALS — BP 133/67 | HR 78 | Resp 18 | Ht 68.0 in | Wt 318.0 lb

## 2017-12-25 DIAGNOSIS — N189 Chronic kidney disease, unspecified: Secondary | ICD-10-CM | POA: Insufficient documentation

## 2017-12-25 DIAGNOSIS — Z96652 Presence of left artificial knee joint: Secondary | ICD-10-CM | POA: Insufficient documentation

## 2017-12-25 DIAGNOSIS — Z880 Allergy status to penicillin: Secondary | ICD-10-CM | POA: Diagnosis not present

## 2017-12-25 DIAGNOSIS — Z79899 Other long term (current) drug therapy: Secondary | ICD-10-CM | POA: Insufficient documentation

## 2017-12-25 DIAGNOSIS — I130 Hypertensive heart and chronic kidney disease with heart failure and stage 1 through stage 4 chronic kidney disease, or unspecified chronic kidney disease: Secondary | ICD-10-CM | POA: Diagnosis present

## 2017-12-25 DIAGNOSIS — I890 Lymphedema, not elsewhere classified: Secondary | ICD-10-CM | POA: Insufficient documentation

## 2017-12-25 DIAGNOSIS — Z9841 Cataract extraction status, right eye: Secondary | ICD-10-CM | POA: Insufficient documentation

## 2017-12-25 DIAGNOSIS — R42 Dizziness and giddiness: Secondary | ICD-10-CM | POA: Insufficient documentation

## 2017-12-25 DIAGNOSIS — I10 Essential (primary) hypertension: Secondary | ICD-10-CM

## 2017-12-25 DIAGNOSIS — Z8249 Family history of ischemic heart disease and other diseases of the circulatory system: Secondary | ICD-10-CM | POA: Insufficient documentation

## 2017-12-25 DIAGNOSIS — I5032 Chronic diastolic (congestive) heart failure: Secondary | ICD-10-CM | POA: Insufficient documentation

## 2017-12-25 DIAGNOSIS — Z7982 Long term (current) use of aspirin: Secondary | ICD-10-CM | POA: Insufficient documentation

## 2017-12-25 DIAGNOSIS — J449 Chronic obstructive pulmonary disease, unspecified: Secondary | ICD-10-CM | POA: Diagnosis not present

## 2017-12-25 DIAGNOSIS — E1122 Type 2 diabetes mellitus with diabetic chronic kidney disease: Secondary | ICD-10-CM | POA: Insufficient documentation

## 2017-12-25 DIAGNOSIS — R531 Weakness: Secondary | ICD-10-CM | POA: Diagnosis not present

## 2017-12-25 DIAGNOSIS — Z9842 Cataract extraction status, left eye: Secondary | ICD-10-CM | POA: Diagnosis not present

## 2017-12-25 NOTE — Patient Instructions (Signed)
Continue weighing daily and call for an overnight weight gain of > 2 pounds or a weekly weight gain of >5 pounds. 

## 2019-08-14 IMAGING — CR DG CHEST 2V
1 series · 3 of 3 positions shown · non-contrast
Comparison: Chest x-ray of July 09, 2017

CLINICAL DATA: CHF, chronic renal insufficiency.

EXAM:
CHEST  2 VIEW

[Series 1: dg chest 2 view · 0.14mm/px · 3 of 3 slices shown]
[im 1/3]
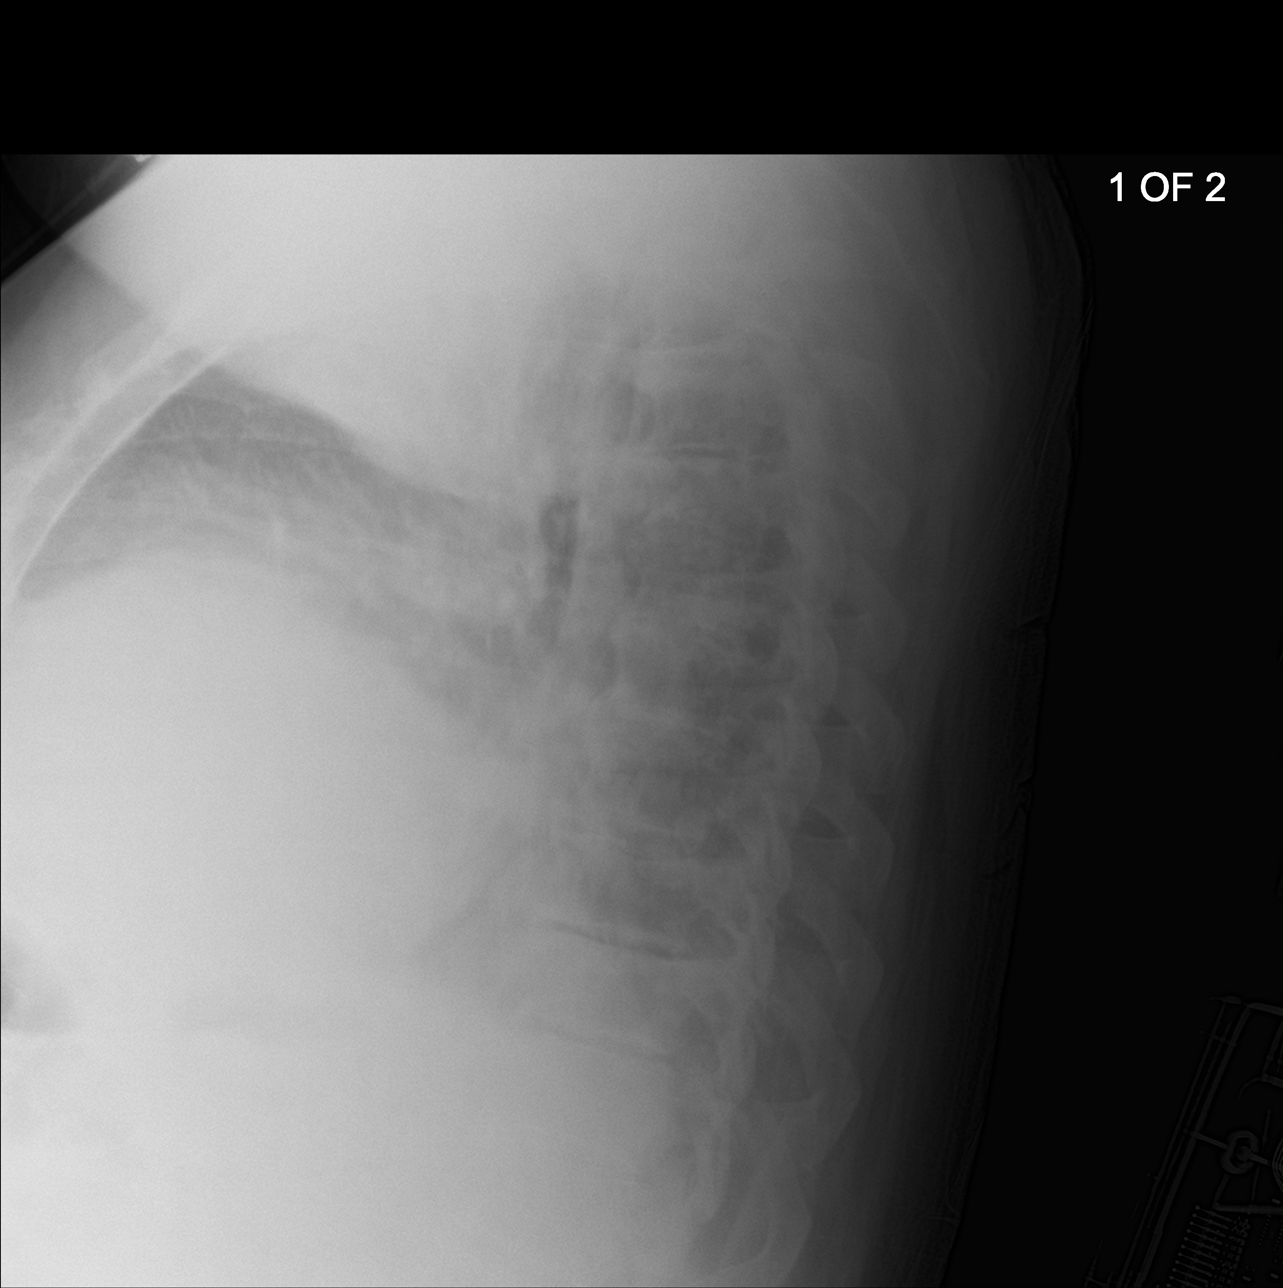
[im 2/3]
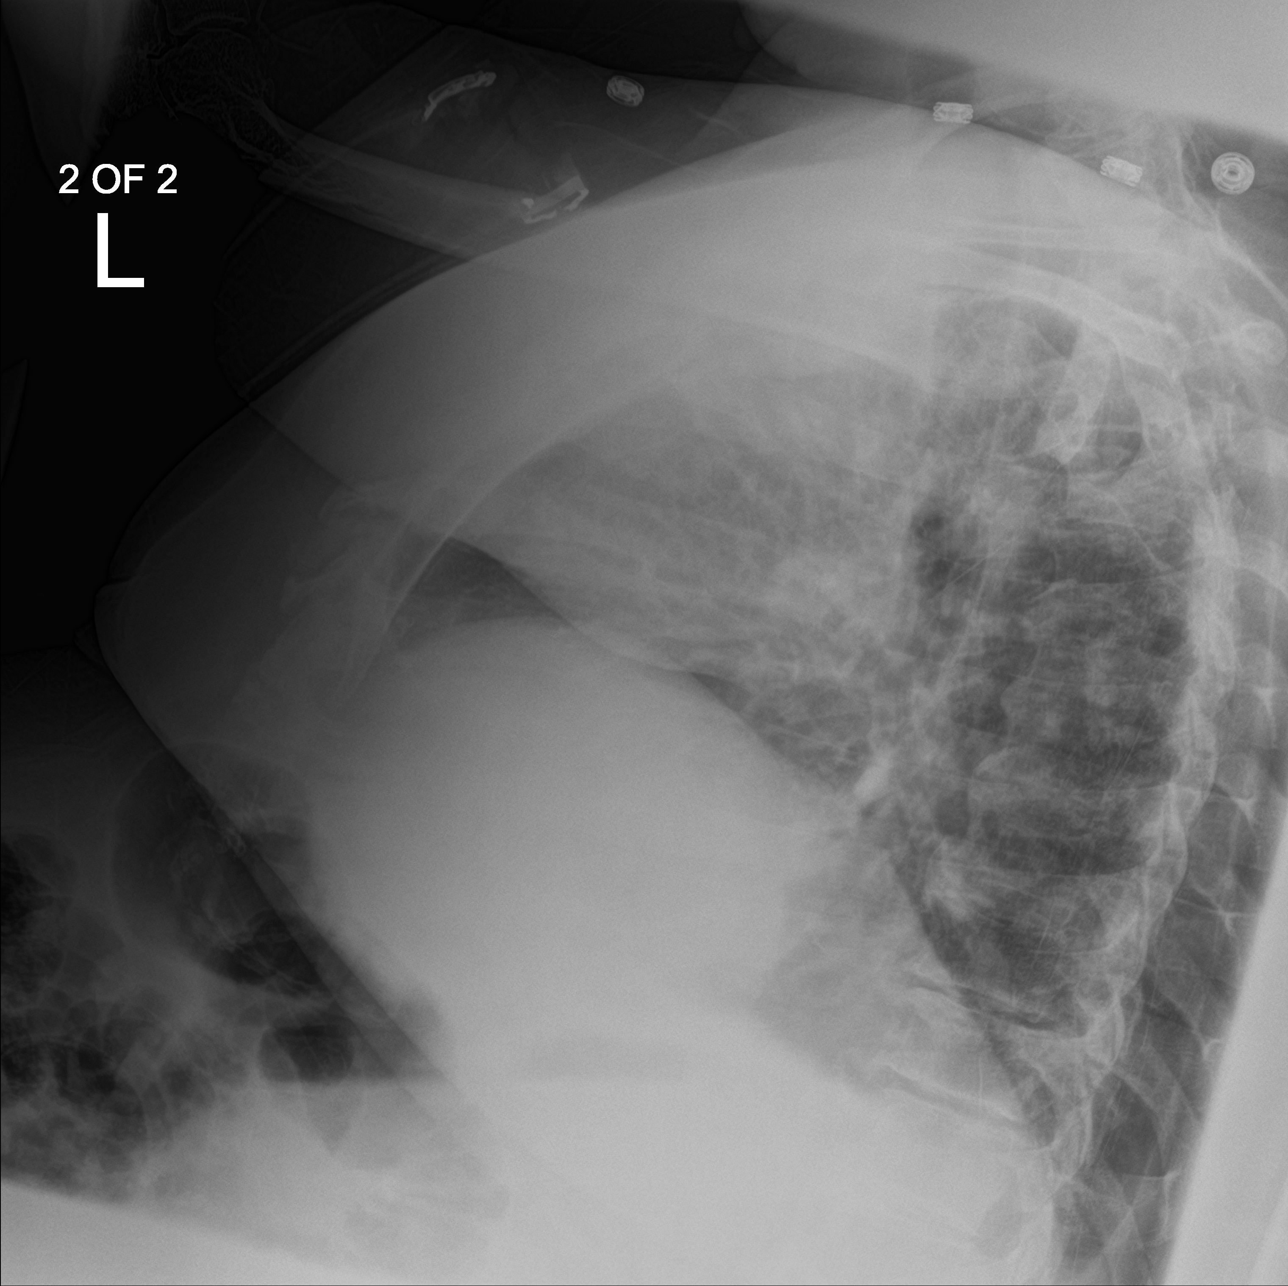
[im 3/3]
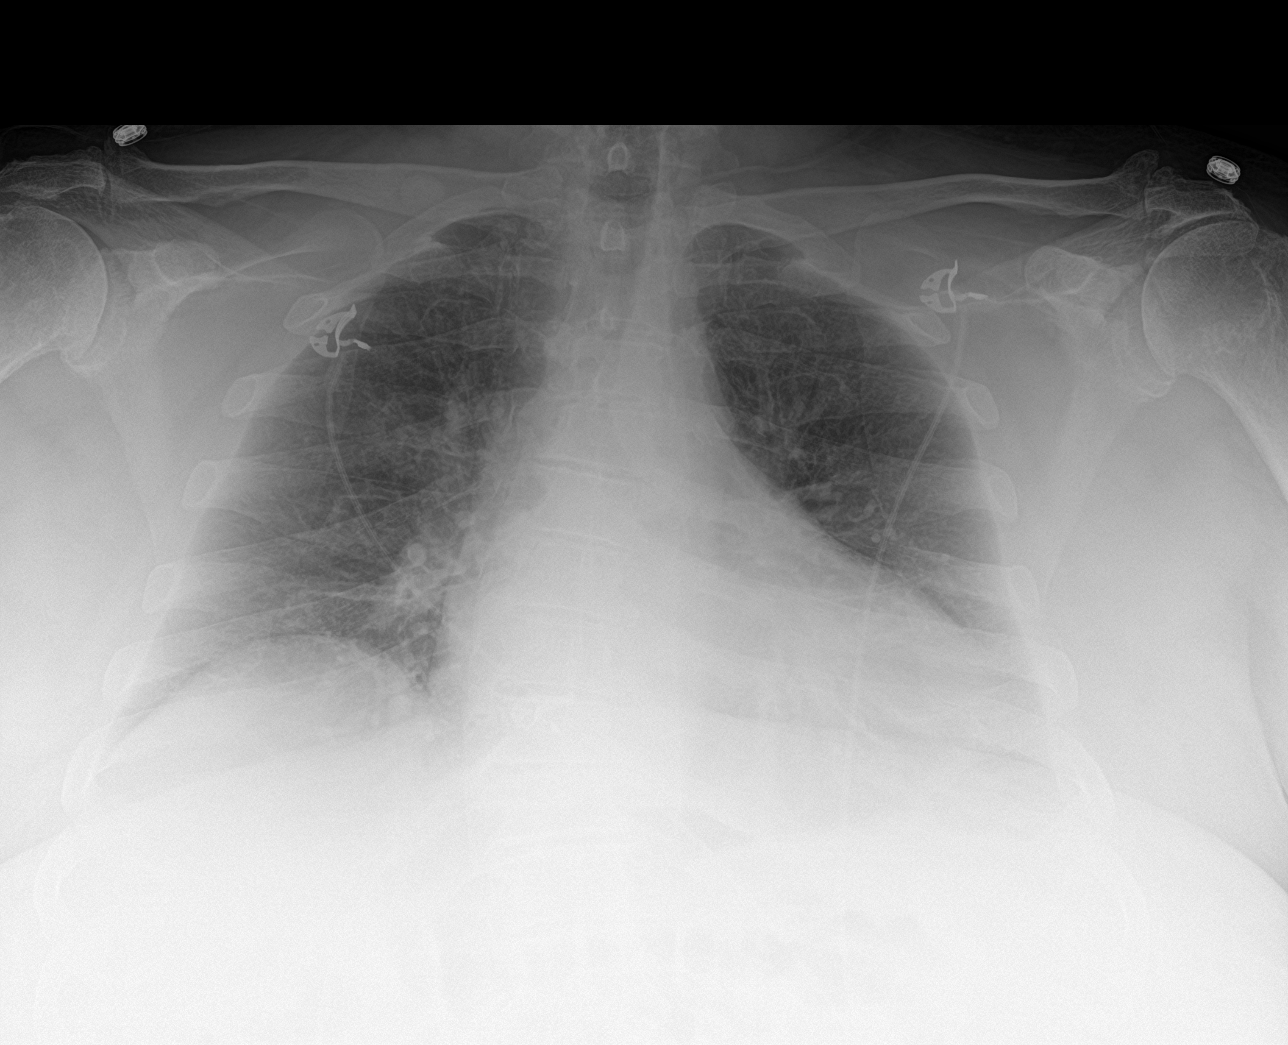

[3 of 3 positions shown; findings below may reference images not displayed]

FINDINGS: The lungs are reasonably well inflated allowing for the lordotic
technique. There is no focal infiltrate. The cardiac silhouette is
mildly enlarged. The central pulmonary vascularity is prominent but
has improved. The trachea is midline. The bony thorax exhibits no
acute abnormality.
IMPRESSION: Improved appearance of the pulmonary interstitium likely reflects
decreasing edema. Persistent mild cardiac enlargement with central
pulmonary vascular prominence. No pneumonia.

## 2021-01-29 DEATH — deceased
